# Patient Record
Sex: Female | Born: 1955 | Race: Black or African American | Hispanic: No | Marital: Married | Smoking: Never smoker
Health system: Southern US, Community
[De-identification: ages and names within clinical notes are randomized; demographics above are authoritative.]

## PROBLEM LIST (undated history)

## (undated) DIAGNOSIS — Z9889 Other specified postprocedural states: Secondary | ICD-10-CM

## (undated) DIAGNOSIS — H269 Unspecified cataract: Secondary | ICD-10-CM

## (undated) DIAGNOSIS — K220 Achalasia of cardia: Secondary | ICD-10-CM

## (undated) DIAGNOSIS — Z800 Family history of malignant neoplasm of digestive organs: Secondary | ICD-10-CM

## (undated) DIAGNOSIS — M810 Age-related osteoporosis without current pathological fracture: Secondary | ICD-10-CM

## (undated) DIAGNOSIS — W19XXXA Unspecified fall, initial encounter: Secondary | ICD-10-CM

## (undated) DIAGNOSIS — Z8042 Family history of malignant neoplasm of prostate: Secondary | ICD-10-CM

## (undated) DIAGNOSIS — Z803 Family history of malignant neoplasm of breast: Secondary | ICD-10-CM

## (undated) DIAGNOSIS — T148XXA Other injury of unspecified body region, initial encounter: Secondary | ICD-10-CM

## (undated) DIAGNOSIS — T7840XA Allergy, unspecified, initial encounter: Secondary | ICD-10-CM

## (undated) DIAGNOSIS — F329 Major depressive disorder, single episode, unspecified: Secondary | ICD-10-CM

## (undated) DIAGNOSIS — M8580 Other specified disorders of bone density and structure, unspecified site: Secondary | ICD-10-CM

## (undated) DIAGNOSIS — R9431 Abnormal electrocardiogram [ECG] [EKG]: Secondary | ICD-10-CM

## (undated) DIAGNOSIS — I341 Nonrheumatic mitral (valve) prolapse: Secondary | ICD-10-CM

## (undated) HISTORY — DX: Other specified disorders of bone density and structure, unspecified site: M85.80

## (undated) HISTORY — DX: Achalasia of cardia: K22.0

## (undated) HISTORY — DX: Family history of malignant neoplasm of breast: Z80.3

## (undated) HISTORY — DX: Allergy, unspecified, initial encounter: T78.40XA

## (undated) HISTORY — DX: Age-related osteoporosis without current pathological fracture: M81.0

## (undated) HISTORY — DX: Nonrheumatic mitral (valve) prolapse: I34.1

## (undated) HISTORY — PX: OTHER SURGICAL HISTORY: SHX169

## (undated) HISTORY — DX: Unspecified fall, initial encounter: W19.XXXA

## (undated) HISTORY — DX: Abnormal electrocardiogram (ECG) (EKG): R94.31

## (undated) HISTORY — DX: Other injury of unspecified body region, initial encounter: T14.8XXA

## (undated) HISTORY — PX: TUBAL LIGATION: SHX77

## (undated) HISTORY — DX: Family history of malignant neoplasm of digestive organs: Z80.0

## (undated) HISTORY — DX: Other specified postprocedural states: Z98.890

## (undated) HISTORY — DX: Family history of malignant neoplasm of prostate: Z80.42

## (undated) HISTORY — PX: MOUTH SURGERY: SHX715

## (undated) HISTORY — PX: ECTOPIC PREGNANCY SURGERY: SHX613

## (undated) HISTORY — DX: Major depressive disorder, single episode, unspecified: F32.9

## (undated) HISTORY — PX: BUNIONECTOMY: SHX129

## (undated) HISTORY — PX: HAMMER TOE SURGERY: SHX385

---
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
## 1982-04-09 DIAGNOSIS — I341 Nonrheumatic mitral (valve) prolapse: Secondary | ICD-10-CM

## 1982-04-09 HISTORY — DX: Nonrheumatic mitral (valve) prolapse: I34.1

## 1998-08-05 ENCOUNTER — Other Ambulatory Visit: Admission: RE | Admit: 1998-08-05 | Discharge: 1998-08-05 | Payer: Self-pay | Admitting: *Deleted

## 1999-01-11 ENCOUNTER — Encounter: Payer: Self-pay | Admitting: *Deleted

## 1999-01-11 ENCOUNTER — Ambulatory Visit (HOSPITAL_COMMUNITY): Admission: RE | Admit: 1999-01-11 | Discharge: 1999-01-11 | Payer: Self-pay | Admitting: *Deleted

## 2000-09-03 ENCOUNTER — Other Ambulatory Visit: Admission: RE | Admit: 2000-09-03 | Discharge: 2000-09-03 | Payer: Self-pay | Admitting: Obstetrics and Gynecology

## 2000-09-12 ENCOUNTER — Ambulatory Visit (HOSPITAL_COMMUNITY): Admission: RE | Admit: 2000-09-12 | Discharge: 2000-09-12 | Payer: Self-pay | Admitting: Obstetrics and Gynecology

## 2000-09-12 ENCOUNTER — Encounter: Payer: Self-pay | Admitting: Obstetrics and Gynecology

## 2001-09-15 ENCOUNTER — Ambulatory Visit (HOSPITAL_COMMUNITY): Admission: RE | Admit: 2001-09-15 | Discharge: 2001-09-15 | Payer: Self-pay | Admitting: Obstetrics and Gynecology

## 2001-09-15 ENCOUNTER — Encounter: Payer: Self-pay | Admitting: Obstetrics and Gynecology

## 2001-09-15 ENCOUNTER — Other Ambulatory Visit: Admission: RE | Admit: 2001-09-15 | Discharge: 2001-09-15 | Payer: Self-pay | Admitting: Obstetrics and Gynecology

## 2002-04-09 DIAGNOSIS — R9431 Abnormal electrocardiogram [ECG] [EKG]: Secondary | ICD-10-CM

## 2002-04-09 HISTORY — DX: Abnormal electrocardiogram (ECG) (EKG): R94.31

## 2002-04-12 ENCOUNTER — Encounter: Payer: Self-pay | Admitting: Emergency Medicine

## 2002-04-12 ENCOUNTER — Emergency Department (HOSPITAL_COMMUNITY): Admission: EM | Admit: 2002-04-12 | Discharge: 2002-04-12 | Payer: Self-pay | Admitting: Emergency Medicine

## 2003-03-26 ENCOUNTER — Other Ambulatory Visit: Admission: RE | Admit: 2003-03-26 | Discharge: 2003-03-26 | Payer: Self-pay | Admitting: Obstetrics and Gynecology

## 2003-03-29 ENCOUNTER — Ambulatory Visit (HOSPITAL_COMMUNITY): Admission: RE | Admit: 2003-03-29 | Discharge: 2003-03-29 | Payer: Self-pay | Admitting: Obstetrics and Gynecology

## 2004-04-04 ENCOUNTER — Ambulatory Visit (HOSPITAL_COMMUNITY): Admission: RE | Admit: 2004-04-04 | Discharge: 2004-04-04 | Payer: Self-pay | Admitting: Obstetrics and Gynecology

## 2004-05-30 ENCOUNTER — Ambulatory Visit: Payer: Self-pay | Admitting: Internal Medicine

## 2004-05-31 ENCOUNTER — Ambulatory Visit: Payer: Self-pay | Admitting: Family Medicine

## 2004-05-31 ENCOUNTER — Ambulatory Visit: Payer: Self-pay | Admitting: Internal Medicine

## 2004-08-03 ENCOUNTER — Other Ambulatory Visit: Admission: RE | Admit: 2004-08-03 | Discharge: 2004-08-03 | Payer: Self-pay | Admitting: Obstetrics and Gynecology

## 2005-01-16 ENCOUNTER — Ambulatory Visit: Payer: Self-pay | Admitting: Internal Medicine

## 2005-01-17 ENCOUNTER — Ambulatory Visit: Payer: Self-pay | Admitting: Internal Medicine

## 2005-05-08 ENCOUNTER — Ambulatory Visit (HOSPITAL_COMMUNITY): Admission: RE | Admit: 2005-05-08 | Discharge: 2005-05-08 | Payer: Self-pay | Admitting: Internal Medicine

## 2005-07-17 ENCOUNTER — Ambulatory Visit: Payer: Self-pay | Admitting: Internal Medicine

## 2005-07-19 ENCOUNTER — Ambulatory Visit: Payer: Self-pay | Admitting: Internal Medicine

## 2005-12-19 ENCOUNTER — Ambulatory Visit: Payer: Self-pay | Admitting: Internal Medicine

## 2006-05-20 ENCOUNTER — Ambulatory Visit: Payer: Self-pay | Admitting: Internal Medicine

## 2006-06-21 ENCOUNTER — Ambulatory Visit (HOSPITAL_COMMUNITY): Admission: RE | Admit: 2006-06-21 | Discharge: 2006-06-21 | Payer: Self-pay | Admitting: Internal Medicine

## 2006-11-25 ENCOUNTER — Encounter (INDEPENDENT_AMBULATORY_CARE_PROVIDER_SITE_OTHER): Payer: Self-pay | Admitting: *Deleted

## 2006-11-25 ENCOUNTER — Ambulatory Visit: Payer: Self-pay | Admitting: Internal Medicine

## 2006-12-02 ENCOUNTER — Encounter (INDEPENDENT_AMBULATORY_CARE_PROVIDER_SITE_OTHER): Payer: Self-pay | Admitting: *Deleted

## 2006-12-04 ENCOUNTER — Other Ambulatory Visit: Admission: RE | Admit: 2006-12-04 | Discharge: 2006-12-04 | Payer: Self-pay | Admitting: Gynecology

## 2006-12-04 ENCOUNTER — Encounter: Payer: Self-pay | Admitting: Internal Medicine

## 2006-12-11 ENCOUNTER — Ambulatory Visit: Payer: Self-pay | Admitting: Internal Medicine

## 2006-12-12 ENCOUNTER — Encounter (INDEPENDENT_AMBULATORY_CARE_PROVIDER_SITE_OTHER): Payer: Self-pay | Admitting: *Deleted

## 2007-02-25 ENCOUNTER — Ambulatory Visit: Payer: Self-pay | Admitting: Internal Medicine

## 2007-08-04 ENCOUNTER — Ambulatory Visit: Payer: Self-pay | Admitting: Internal Medicine

## 2007-08-06 ENCOUNTER — Encounter (INDEPENDENT_AMBULATORY_CARE_PROVIDER_SITE_OTHER): Payer: Self-pay | Admitting: *Deleted

## 2007-08-27 ENCOUNTER — Ambulatory Visit: Payer: Self-pay | Admitting: Pulmonary Disease

## 2007-09-19 ENCOUNTER — Ambulatory Visit (HOSPITAL_BASED_OUTPATIENT_CLINIC_OR_DEPARTMENT_OTHER): Admission: RE | Admit: 2007-09-19 | Discharge: 2007-09-19 | Payer: Self-pay | Admitting: Pulmonary Disease

## 2007-09-19 ENCOUNTER — Encounter: Payer: Self-pay | Admitting: Pulmonary Disease

## 2007-09-30 ENCOUNTER — Ambulatory Visit: Payer: Self-pay | Admitting: Pulmonary Disease

## 2007-10-15 ENCOUNTER — Ambulatory Visit: Payer: Self-pay | Admitting: Pulmonary Disease

## 2007-10-23 ENCOUNTER — Telehealth (INDEPENDENT_AMBULATORY_CARE_PROVIDER_SITE_OTHER): Payer: Self-pay | Admitting: *Deleted

## 2007-11-03 ENCOUNTER — Ambulatory Visit: Payer: Self-pay | Admitting: Internal Medicine

## 2007-12-05 ENCOUNTER — Other Ambulatory Visit: Admission: RE | Admit: 2007-12-05 | Discharge: 2007-12-05 | Payer: Self-pay | Admitting: Gynecology

## 2008-01-24 ENCOUNTER — Ambulatory Visit: Payer: Self-pay | Admitting: Internal Medicine

## 2009-01-15 ENCOUNTER — Emergency Department (HOSPITAL_COMMUNITY): Admission: EM | Admit: 2009-01-15 | Discharge: 2009-01-15 | Payer: Self-pay | Admitting: Emergency Medicine

## 2009-01-18 ENCOUNTER — Ambulatory Visit: Payer: Self-pay | Admitting: Internal Medicine

## 2009-01-20 ENCOUNTER — Encounter (INDEPENDENT_AMBULATORY_CARE_PROVIDER_SITE_OTHER): Payer: Self-pay | Admitting: *Deleted

## 2009-01-20 ENCOUNTER — Ambulatory Visit (HOSPITAL_COMMUNITY): Admission: RE | Admit: 2009-01-20 | Discharge: 2009-01-20 | Payer: Self-pay | Admitting: Gynecology

## 2009-01-20 LAB — CONVERTED CEMR LAB
Creatinine, Ser: 0.8 mg/dL (ref 0.4–1.2)
GFR calc non Af Amer: 97.26 mL/min (ref >60)

## 2009-04-22 ENCOUNTER — Ambulatory Visit: Payer: Self-pay | Admitting: Internal Medicine

## 2009-04-22 DIAGNOSIS — F3289 Other specified depressive episodes: Secondary | ICD-10-CM | POA: Insufficient documentation

## 2009-04-22 DIAGNOSIS — F329 Major depressive disorder, single episode, unspecified: Secondary | ICD-10-CM | POA: Insufficient documentation

## 2009-04-22 DIAGNOSIS — J302 Other seasonal allergic rhinitis: Secondary | ICD-10-CM | POA: Insufficient documentation

## 2009-04-25 ENCOUNTER — Encounter (INDEPENDENT_AMBULATORY_CARE_PROVIDER_SITE_OTHER): Payer: Self-pay | Admitting: *Deleted

## 2009-04-28 ENCOUNTER — Other Ambulatory Visit: Admission: RE | Admit: 2009-04-28 | Discharge: 2009-04-28 | Payer: Self-pay | Admitting: Gynecology

## 2009-04-28 ENCOUNTER — Ambulatory Visit: Payer: Self-pay | Admitting: Gynecology

## 2009-06-28 ENCOUNTER — Ambulatory Visit: Payer: Self-pay | Admitting: Gynecology

## 2009-09-23 ENCOUNTER — Encounter (INDEPENDENT_AMBULATORY_CARE_PROVIDER_SITE_OTHER): Payer: Self-pay | Admitting: *Deleted

## 2009-09-26 ENCOUNTER — Encounter (INDEPENDENT_AMBULATORY_CARE_PROVIDER_SITE_OTHER): Payer: Self-pay | Admitting: *Deleted

## 2009-09-27 ENCOUNTER — Ambulatory Visit: Payer: Self-pay | Admitting: Gastroenterology

## 2009-10-03 ENCOUNTER — Ambulatory Visit: Payer: Self-pay | Admitting: Gastroenterology

## 2010-02-22 ENCOUNTER — Ambulatory Visit (HOSPITAL_COMMUNITY): Admission: RE | Admit: 2010-02-22 | Discharge: 2010-02-22 | Payer: Self-pay | Admitting: Gynecology

## 2010-04-09 DIAGNOSIS — F32A Depression, unspecified: Secondary | ICD-10-CM

## 2010-04-09 HISTORY — PX: COLONOSCOPY: SHX174

## 2010-04-09 HISTORY — DX: Depression, unspecified: F32.A

## 2010-04-30 ENCOUNTER — Encounter: Payer: Self-pay | Admitting: Internal Medicine

## 2010-05-01 ENCOUNTER — Other Ambulatory Visit
Admission: RE | Admit: 2010-05-01 | Discharge: 2010-05-01 | Payer: Self-pay | Source: Home / Self Care | Admitting: Gynecology

## 2010-05-01 ENCOUNTER — Ambulatory Visit
Admission: RE | Admit: 2010-05-01 | Discharge: 2010-05-01 | Payer: Self-pay | Source: Home / Self Care | Attending: Gynecology | Admitting: Gynecology

## 2010-05-01 ENCOUNTER — Other Ambulatory Visit: Payer: Self-pay | Admitting: Gynecology

## 2010-05-07 LAB — CONVERTED CEMR LAB
AST: 19 units/L (ref 0–37)
Albumin: 3.7 g/dL (ref 3.5–5.2)
Alkaline Phosphatase: 68 units/L (ref 39–117)
Alkaline Phosphatase: 74 units/L (ref 39–117)
BUN: 10 mg/dL (ref 6–23)
BUN: 13 mg/dL (ref 6–23)
BUN: 6 mg/dL (ref 6–23)
Basophils Absolute: 0 10*3/uL (ref 0.0–0.1)
Basophils Absolute: 0 10*3/uL (ref 0.0–0.1)
Bilirubin, Direct: 0.1 mg/dL (ref 0.0–0.3)
CO2: 33 meq/L — ABNORMAL HIGH (ref 19–32)
CO2: 33 meq/L — ABNORMAL HIGH (ref 19–32)
Calcium: 9 mg/dL (ref 8.4–10.5)
Calcium: 9.5 mg/dL (ref 8.4–10.5)
Chloride: 101 meq/L (ref 96–112)
Chloride: 104 meq/L (ref 96–112)
Chloride: 105 meq/L (ref 96–112)
Cholesterol: 172 mg/dL (ref 0–200)
Cholesterol: 185 mg/dL (ref 0–200)
Cholesterol: 192 mg/dL (ref 0–200)
Eosinophils Absolute: 0 10*3/uL (ref 0.0–0.7)
Free T4: 0.6 ng/dL (ref 0.6–1.6)
GFR calc Af Amer: 86 mL/min
Glucose, Bld: 62 mg/dL — ABNORMAL LOW (ref 70–99)
Glucose, Bld: 86 mg/dL (ref 70–99)
Glucose, Bld: 88 mg/dL (ref 70–99)
HCT: 39 % (ref 36.0–46.0)
HCT: 40.3 % (ref 36.0–46.0)
HDL: 101.7 mg/dL (ref >39.0)
HDL: 87 mg/dL (ref >39)
Hemoglobin: 13.3 g/dL (ref 12.0–15.0)
Indirect Bilirubin: 0.5 mg/dL (ref 0.0–0.9)
LDL Cholesterol: 64 mg/dL (ref 0–99)
LDL Cholesterol: 75 mg/dL (ref 0–99)
Lymphocytes Relative: 37 % (ref 12.0–46.0)
MCHC: 32.9 g/dL (ref 30.0–36.0)
MCHC: 33 g/dL (ref 30.0–36.0)
MCHC: 33.8 g/dL (ref 30.0–36.0)
MCV: 88.4 fL (ref 78.0–100.0)
MCV: 90.5 fL (ref 78.0–100.0)
MCV: 92.2 fL (ref 78.0–100.0)
Monocytes Absolute: 0.3 10*3/uL (ref 0.1–1.0)
Monocytes Absolute: 0.3 10*3/uL (ref 0.2–0.7)
Monocytes Relative: 4.8 % (ref 3.0–12.0)
Monocytes Relative: 4.9 % (ref 3.0–11.0)
Neutro Abs: 3.4 10*3/uL (ref 1.4–7.7)
Neutro Abs: 3.8 10*3/uL (ref 1.4–7.7)
Neutrophils Relative %: 50.8 % (ref 43.0–77.0)
Neutrophils Relative %: 57.4 % (ref 43.0–77.0)
Platelets: 194 10*3/uL (ref 150–400)
Platelets: 196 10*3/uL (ref 150.0–400.0)
Platelets: 202 10*3/uL (ref 150–400)
Potassium: 4.3 meq/L (ref 3.5–5.1)
Potassium: 4.3 meq/L (ref 3.5–5.3)
RBC: 4.37 M/uL (ref 3.87–5.11)
RBC: 4.58 M/uL (ref 3.87–5.11)
RDW: 13.4 % (ref 11.5–14.6)
RDW: 13.6 % (ref 11.5–14.6)
RDW: 13.8 % (ref 11.5–14.6)
Sodium: 140 meq/L (ref 135–145)
Sodium: 140 meq/L (ref 135–145)
Sodium: 143 meq/L (ref 135–145)
T3, Free: 3.2 pg/mL (ref 2.3–4.2)
TSH: 1.1 microintl units/mL (ref 0.35–5.50)
Total CHOL/HDL Ratio: 1.8
Total Protein: 7.4 g/dL (ref 6.0–8.3)
Triglycerides: 44 mg/dL (ref 0–149)
Uric Acid, Serum: 4.7 mg/dL (ref 2.4–7.0)
VLDL: 8 mg/dL (ref 0–40)
VLDL: 9 mg/dL (ref 0–40)
WBC: 6.4 10*3/uL (ref 4.5–10.5)
WBC: 6.9 10*3/uL (ref 4.5–10.5)
aPTT: 32 s (ref 24–37)

## 2010-05-09 NOTE — Miscellaneous (Signed)
Summary: previsit/rm  Clinical Lists Changes  Medications: Added new medication of MOVIPREP 100 GM  SOLR (PEG-KCL-NACL-NASULF-NA ASC-C) As per prep instructions. - Signed Rx of MOVIPREP 100 GM  SOLR (PEG-KCL-NACL-NASULF-NA ASC-C) As per prep instructions.;  #1 x 0;  Signed;  Entered by: Sherren Kerns RN;  Authorized by: Meryl Dare MD [facility];  Method used: [facility] [phone]*, [address], [place], [place], [place]  [address], Ph: [phone], Fax: [phone] Observations: Added new observation of ALLERGY REV: Done (09/27/2009 15:51)    Prescriptions: MOVIPREP 100 GM  SOLR (PEG-KCL-NACL-NASULF-NA ASC-C) As per prep instructions.  #1 x 0   Entered by:   Sherren Kerns RN   Authorized by:   Meryl Dare MD [facility]   Signed by:   Sherren Kerns RN on 09/27/2009   Method used:   Electronically to        [facility] [phone]* (retail)       [address]       [place], [place]  [address]       Ph: [phone]       Fax: [phone]   RxID:   [id number]

## 2010-05-09 NOTE — Letter (Signed)
Summary: Previsit letter  [facility]  [address] [place], [place] [address]   Phone: [phone]  Fax: [phone]       09/23/2009 MRN: [id number]  [facility] [address] [place], [place]  [address]  Dear Ms. Kronk,  Welcome to the Gastroenterology Division at [facility].    You are scheduled to see a nurse for your pre-procedure visit on 09-27-09 at 4:00p.m. on the 3rd floor at [facility], [address]  We ask that you try to arrive at our office 15 minutes prior to your appointment time to allow for check-in.  Your nurse visit will consist of discussing your medical and surgical history, your immediate family medical history, and your medications.    Please bring a complete list of all your medications or, if you prefer, bring the medication bottles and we will list them.  We will need to be aware of both prescribed and over the counter drugs.  We will need to know exact dosage information as well.  If you are on blood thinners (Coumadin, Plavix, Aggrenox, Ticlid, etc.) please call our office today/prior to your appointment, as we need to consult with your physician about holding your medication.   Please be prepared to read and sign documents such as consent forms, a financial agreement, and acknowledgement forms.  If necessary, and with your consent, a friend or relative is welcome to sit-in on the nurse visit with you.  Please bring your insurance card so that we may make a copy of it.  If your insurance requires a referral to see a specialist, please bring your referral form from your primary care physician.  No co-pay is required for this nurse visit.     If you cannot keep your appointment, please call [phone] to cancel or reschedule prior to your appointment date.  This allows [place] the opportunity to schedule an appointment for another patient in need of care.    Thank you for choosing [facility] for your medical needs.   We appreciate the opportunity to care for you.  Please visit [place] at our website  to learn more about our practice.                     Sincerely.                                                                                                                   The Gastroenterology Division

## 2010-05-09 NOTE — Letter (Signed)
Summary: Results Follow up Letter  [facility] [place]  [address] [place], [place] [address]   Phone: [phone]  Fax: [phone]    04/25/2009 MRN: [id number]  [facility] [address] [place], [place]  [address]  Dear Sylvia Mcintosh,  The following are the results of your recent test(s):  Test         Result    Pap Smear:        Normal _____  Not Normal _____ Comments: ______________________________________________________ Cholesterol: LDL(Bad cholesterol):         Your goal is less than:         HDL (Good cholesterol):       Your goal is more than: Comments:  ______________________________________________________ Mammogram:        Normal _____  Not Normal _____ Comments:  ___________________________________________________________________ Hemoccult:        Normal _____  Not normal _______ Comments:    _____________________________________________________________________ Other Tests: Please see attached labs done on 04/22/2009    We routinely do not discuss normal results over the telephone.  If you desire a copy of the results, or you have any questions about this information we can discuss them at your next office visit.   Sincerely,

## 2010-05-09 NOTE — Letter (Signed)
Summary: [facility] Instructions  [facility]  [address] [place], [place] [address]   Phone: [phone]  Fax: [phone]       Sylvia Mcintosh    09/24/1955    MRN: [id number]        Procedure Day /Date:  10/03/09  Monday     Arrival Time:  9:30am      Procedure Time: 10:30am     Location of Procedure:                    _x _  [facility] (4th Floor)   PREPARATION FOR COLONOSCOPY WITH MOVIPREP   Starting 5 days prior to your procedure 09/28/09  do not eat nuts, seeds, popcorn, corn, beans, peas,  salads, or any raw vegetables.  Do not take any fiber supplements (e.g. Metamucil, Citrucel, and Benefiber).  THE DAY BEFORE YOUR PROCEDURE         DATE:   10/02/09  DAY:  Sunday  1.  Drink clear liquids the entire day-NO SOLID FOOD  2.  Do not drink anything colored red or purple.  Avoid juices with pulp.  No orange juice.  3.  Drink at least 64 oz. (8 glasses) of fluid/clear liquids during the day to prevent dehydration and help the prep work efficiently.  CLEAR LIQUIDS INCLUDE: Water Jello Ice Popsicles Tea (sugar ok, no milk/cream) Powdered fruit flavored drinks Coffee (sugar ok, no milk/cream) Gatorade Juice: apple, white grape, white cranberry  Lemonade Clear bullion, consomm, broth Carbonated beverages (any kind) Strained chicken noodle soup Hard Candy                             4.  In the morning, mix first dose of MoviPrep solution:    Empty 1 Pouch A and 1 Pouch B into the disposable container    Add lukewarm drinking water to the top line of the container. Mix to dissolve    Refrigerate (mixed solution should be used within 24 hrs)  5.  Begin drinking the prep at 5:00 p.m. The MoviPrep container is divided by 4 marks.   Every 15 minutes drink the solution down to the next mark (approximately 8 oz) until the full liter is complete.   6.  Follow completed prep with 16 oz of clear liquid of your choice (Nothing red or purple).   Continue to drink clear liquids until bedtime.  7.  Before going to bed, mix second dose of MoviPrep solution:    Empty 1 Pouch A and 1 Pouch B into the disposable container    Add lukewarm drinking water to the top line of the container. Mix to dissolve    Refrigerate  THE DAY OF YOUR PROCEDURE      DATE:  6//27/11 DAY:  Monday  Beginning at 5:30 a.m. (5 hours before procedure):         1. Every 15 minutes, drink the solution down to the next mark (approx 8 oz) until the full liter is complete.  2. Follow completed prep with 16 oz. of clear liquid of your choice.    3. You may drink clear liquids until  8:30 a.m. (2 HOURS BEFORE PROCEDURE).   MEDICATION INSTRUCTIONS  Unless otherwise instructed, you should take regular prescription medications with a small sip of water   as early as possible the morning of your procedure.    Additional medication instructions: n/a  OTHER INSTRUCTIONS  You will need a responsible adult at least 53 years of age to accompany you and drive you home.   This person must remain in the waiting room during your procedure.  Wear loose fitting clothing that is easily removed.  Leave jewelry and other valuables at home.  However, you may wish to bring a book to read or  an iPod/MP3 player to listen to music as you wait for your procedure to start.  Remove all body piercing jewelry and leave at home.  Total time from sign-in until discharge is approximately 2-3 hours.  You should go home directly after your procedure and rest.  You can resume normal activities the  day after your procedure.  The day of your procedure you should not:   Drive   Make legal decisions   Operate machinery   Drink alcohol   Return to work  You will receive specific instructions about eating, activities and medications before you leave.    The above instructions have been reviewed and explained to me by   Sherren Kerns RN  September 27, 2009 4:16  PM     I fully understand and can verbalize these instructions _____________________________ Date _________

## 2010-05-09 NOTE — Assessment & Plan Note (Signed)
Summary: CPX//PH   Vital Signs:  Patient profile:   55 year old female Height:      65.5 inches Weight:      159.8 pounds BMI:     26.28 Temp:     98.5 degrees F oral Pulse rate:   72 / minute Resp:     14 per minute BP sitting:   116 / 78  (left arm) Cuff size:   large  Vitals Entered By: Shonna Chock (April 22, 2009 9:30 AM)  Comments REVIEWED MED LIST, PATIENT AGREED DOSE AND INSTRUCTION CORRECT    Primary Care Provider:  Lona Kettle   History of Present Illness: Sylvia Mcintosh is here for a physical; she is asymptomatic.  Preventive Screening-Counseling & Management  Caffeine-Diet-Exercise     Does Patient Exercise: yes  Allergies (verified): No Known Drug Allergies  Past History:  Past Medical History: SBE PROPHYLAXIS PREVIOUSLY  REQUIRED as per [employer] physicians for  MVP RIGHT femoral bruit non specific ST changes with negative stress test 2004 elevated homocysteine level  Allergic rhinitis  Past Surgical History: Tubal ligation ; reversed gravid 6 para 2 miscarriage 4(tubal pregnancies times two); Bunionectomy & hammer toe surgery bilat; Oral Surgery (implants) ; No Colonoscopy to date [facility] reviewed)  Family History: Father: ?health history Mother: breast cancer Siblings: sister  &bro HTN;son  possible SIDS 1993(negative autopsy); M cousin colon cancer in her 24s  Social History: Never Smoked. Alcohol use-no Occupation: Banker Married Regular exercise-yes: 2X/week CVE  Review of Systems  The patient denies anorexia, fever, vision loss, decreased hearing, hoarseness, chest pain, syncope, dyspnea on exertion, peripheral edema, prolonged cough, headaches, hemoptysis, abdominal pain, melena, hematochezia, severe indigestion/heartburn, hematuria, incontinence, suspicious skin lesions, unusual weight change, abnormal bleeding, enlarged lymph nodes, and angioedema.         Weight loss 30 # over 5 months with TLC. Psych:  Denies  anxiety, easily angered, easily tearful, and irritability; Rereading data regarding son's death has caused some depression;she questions PTSD.  Physical Exam  General:  well-nourished,in no acute distress; alert,appropriate and cooperative throughout examination Head:  Normocephalic and atraumatic without obvious abnormalities.  Eyes:  No corneal or conjunctival inflammation noted. Arcus senilis. Perrla. Funduscopic exam benign, without hemorrhages, exudates or papilledema.  Ears:  External ear exam shows no significant lesions or deformities.  Otoscopic examination reveals clear canals, tympanic membranes are intact bilaterally without bulging, retraction, inflammation or discharge. Hearing is grossly normal bilaterally.Some wax bilaterally Nose:  External nasal examination shows no deformity or inflammation. Nasal mucosa are pink and moist without lesions or exudates. Mouth:  Oral mucosa and oropharynx without lesions or exudates.  Teeth in good repair. Neck:  No deformities, masses, or tenderness noted. Lungs:  Normal respiratory effort, chest expands symmetrically. Lungs are clear to auscultation, no crackles or wheezes. Heart:  Normal rate and regular rhythm. S1 and S2 normal without gallop, murmur, click, rub . S4 with  minor slurring Abdomen:  Bowel sounds positive,abdomen soft and non-tender without masses, organomegaly or hernias noted. Genitalia:  Dr Audie Box Msk:  No deformity or scoliosis noted of thoracic or lumbar spine.   Pulses:  R and L carotid,radial,dorsalis pedis and posterior tibial pulses are full and equal bilaterally Extremities:  No clubbing, cyanosis, edema, or deformity noted with normal full range of motion of all joints. Osteophyte L index DIP Neurologic:  alert & oriented X3 and DTRs symmetrical and normal.   Skin:  Intact without suspicious lesions or rashes Cervical Nodes:  No lymphadenopathy noted Axillary Nodes:  No palpable lymphadenopathy Psych:  memory  intact for recent and remote,  good eye contact, not anxious appearing, but  subdued.     Impression & Recommendations:  Problem # 1:  PREVENTIVE HEALTH CARE (ICD-V70.0)  Orders: EKG w/ Interpretation (93000) Venipuncture (40981) TLB-Lipid Panel (80061-LIPID) TLB-BMP (Basic Metabolic Panel-BMET) (80048-METABOL) TLB-CBC Platelet - w/Differential (85025-CBCD) TLB-Hepatic/Liver Function Pnl (80076-HEPATIC) TLB-TSH (Thyroid Stimulating Hormone) (84443-TSH)  Problem # 2:  DEPRESSIVE DISORDER NOT ELSEWHERE CLASSIFIED (ICD-311) Situational, mild  Problem # 3:  ALLERGIC RHINITIS (ICD-477.9)  Her updated medication list for this problem includes:    Claritin 10 Mg Caps (Loratadine) .Marland Kitchen... 1 once daily prn  Complete Medication List: 1)  Claritin 10 Mg Caps (Loratadine) .Marland Kitchen.. 1 once daily prn  Patient Instructions: 1)  Please call if medication trial desired  Appended Document: Orders Update    Clinical Lists Changes  Orders: Added new Test order of T- * Misc. Laboratory test [phone]) - Signed

## 2010-05-09 NOTE — Procedures (Signed)
Summary: Colonoscopy  Patient: Sylvia Mcintosh Note: All result statuses are Final unless otherwise noted.  Tests: (1) Colonoscopy (COL)   COL Colonoscopy           DONE     [facility]     [address]     [place], [place]  [address]           COLONOSCOPY PROCEDURE REPORT           PATIENT:  [name], [name]  MR#:  [id number]     BIRTHDATE:  April 30, 1955, 53 yrs. old  GENDER:  female     ENDOSCOPIST:  Judie Petit T. Russella Dar, MD, [facility]     Referred by:  Marga Melnick, M.D.     PROCEDURE DATE:  10/03/2009     PROCEDURE:  Colonoscopy 562[address]     ASA CLASS:  Class I     INDICATIONS:  1) Routine Risk Screening     MEDICATIONS:   Fentanyl 75 mcg IV, Versed 7 mg IV     DESCRIPTION OF PROCEDURE:   After the risks benefits and     alternatives of the procedure were thoroughly explained, informed     consent was obtained.  Digital rectal exam was performed and     revealed no abnormalities.   The LB [id number]-[id number] endoscope     was introduced through the anus and advanced to the cecum, which     was identified by both the appendix and ileocecal valve, without     limitations.  The quality of the prep was good, using MoviPrep.     The instrument was then slowly withdrawn as the colon was fully     examined.     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 A normal     appearing cecum, ileocecal valve, and appendiceal orifice were     identified. The ascending, hepatic flexure, transverse, splenic     flexure and rectum appeared unremarkable. Retroflexed views in the     rectum revealed no abnormalities.    The time to cecum =  3     minutes. The scope was then withdrawn (time =  9.75  min) from the     patient and the procedure completed.     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     2)  Continue current colorectal screening recommendations for     "routine risk" patients with a repeat colonoscopy in 10 years.           Venita Lick. Russella Dar, MD, Clementeen Graham           n.     eSIGNED:   Venita Lick. Stark at 10/03/2009 10:48 AM           Adachi, Vickey Huger, [id number]  Note: An exclamation mark (!) indicates a result that was not dispersed into the flowsheet. Document Creation Date: 10/03/2009 10:50 AM _______________________________________________________________________  (1) Order result status: Final Collection or observation date-time: 10/03/2009 10:45 Requested date-time:  Receipt date-time:  Reported date-time:  Referring Physician:   Ordering Physician: Claudette Head [id number]) Specimen Source:  Source: Launa Grill Order Number: [id number] Lab site:   Appended Document: Colonoscopy     Procedures  Next Due Date:    Colonoscopy: 10/2019

## 2010-06-12 ENCOUNTER — Encounter: Payer: Self-pay | Admitting: Internal Medicine

## 2010-06-12 ENCOUNTER — Ambulatory Visit (INDEPENDENT_AMBULATORY_CARE_PROVIDER_SITE_OTHER): Payer: BC Managed Care – PPO | Admitting: Internal Medicine

## 2010-06-12 DIAGNOSIS — H15009 Unspecified scleritis, unspecified eye: Secondary | ICD-10-CM | POA: Insufficient documentation

## 2010-06-20 NOTE — Assessment & Plan Note (Signed)
Summary: left eye--broken blood veins///sph   Vital Signs:  Patient profile:   55 year old female Weight:      166.6 pounds BMI:     27.40 Temp:     98.1 degrees F oral Pulse rate:   76 / minute Resp:     14 per minute BP sitting:   118 / 72  (left arm) Cuff size:   large  Vitals Entered By: Shonna Chock CMA (June 12, 2010 4:28 PM) CC: Left eye concerns: on Saturday patient with one red spot in the corner of eye, now spot has spread. Patient denies pain, blurred vision or injury to eye   Primary Care Provider:  Lona Kettle  CC:  Left eye concerns: on Saturday patient with one red spot in the corner of eye, now spot has spread. Patient denies pain, and blurred vision or injury to eye.  History of Present Illness:    Onset 03/03 as painless broken blood vessels in OS  noted by husband; later that day she had headache above the eye when she stayed up late. The same symptoms recurred last night.  in both instancces symptms resolved with rest.  See ROS   Current Medications (verified): 1)  Claritin 10 Mg  Caps (Loratadine) .Marland Kitchen.. 1 Once Daily Prn  Allergies (verified): No Known Drug Allergies  Review of Systems General:  Denies chills, fever, and sweats. Eyes:  Complains of eye irritation and light sensitivity; denies blurring, discharge, double vision, and vision loss-1 eye. ENT:  Complains of earache; denies ear discharge and nosebleeds; No facial pain , purulence or frontal  headache except late @ night.  L earache 03/04 for short period . Resp:  Denies coughing up blood. GI:  Denies bloody stools and dark tarry stools. GU:  Denies hematuria. Heme:  Denies abnormal bruising and bleeding.  Physical Exam  General:  well-nourished,in no acute distress; alert,appropriate and cooperative throughout examination Eyes:  No corneal or conjunctival inflammation noted. EOMI EXCEPT OS deviates laterally with accommodation. Arcus senilis.Slight scleritis OS upper , outer quadrant. Perrla.  Vision grossly normal. Ears:  L ear normal.  Wax on R Nose:  External nasal examination shows no deformity or inflammation. Nasal mucosa are pink and moist without lesions or exudates. Mouth:  Oral mucosa and oropharynx without lesions or exudates.  Teeth in good repair. Skin:  Intact without suspicious lesions or rashes Cervical Nodes:  No lymphadenopathy noted Axillary Nodes:  No palpable lymphadenopathy   Impression & Recommendations:  Problem # 1:  SCLERITIS (ICD-379.00) no evidence of infection , HTN, or bleeding dyscrasia  Complete Medication List: 1)  Claritin 10 Mg Caps (Loratadine) .Marland Kitchen.. 1 once daily prn  Patient Instructions: 1)  Natural Tears as needed to keep eye moist . 2)  See your eye doctor yearly; discuss Arcus Senilis & decreased accommodation OS .   Orders Added: 1)  Est. Patient Level III [04540]

## 2010-07-13 LAB — GLUCOSE, CAPILLARY: Glucose-Capillary: 83 mg/dL (ref 70–99)

## 2010-08-08 ENCOUNTER — Encounter: Payer: BC Managed Care – PPO | Admitting: Internal Medicine

## 2010-08-17 ENCOUNTER — Encounter: Payer: BC Managed Care – PPO | Admitting: Internal Medicine

## 2010-08-22 NOTE — Procedures (Signed)
NAMEMABLE, DARA NO.:  [id number]   MEDICAL RECORD NO.:  [id number]          PATIENT TYPE:  OUT   LOCATION:  SLEEP CENTER                 FACILITY:  [facility]   PHYSICIAN:  Barbaraann Share, MD,FCCPDATE OF BIRTH:  07[birth year]07/1955   DATE OF STUDY:  09/19/2007                            NOCTURNAL POLYSOMNOGRAM   REFERRING PHYSICIAN:  Barbaraann Share, MD,FCCP   INDICATION FOR STUDY:  Hypersomnia, with sleep apnea.   EPWORTH SLEEPINESS SCORE:  16.   MEDICATIONS:   SLEEP ARCHITECTURE:  The patient had a total sleep time of 387 minutes,  with very little slow-wave sleep, and decreased REM.  Sleep onset  latency was normal at 4 minutes, and REM onset was normal at 77 minutes.  Sleep efficiency was fairly good at 93%.   RESPIRATORY DATA:  The patient was found to have 2 apneas and no  hypopneas, for an AHI of 0.3 events per hour.  There was mild snoring  noted throughout.   OXYGEN DATA:  There was transient O2 desaturation to 89% with the  patient's rare obstructive events.   CARDIAC DATA:  No clinically significant arrhythmias were noted.   MOVEMENT-PARASOMNIA:  The patient had no abnormal leg jerks or  behaviors.   IMPRESSIONS-RECOMMENDATIONS:  Small numbers of obstructive events which  do not meet the AHI criteria for the obstructive sleep apnea syndrome.  If the patient continues to have significant daytime sleepiness, further  evaluation for other sleep disorders or medical issues should be  considered.      Barbaraann Share, MD,FCCP  Diplomate, American Board of Sleep  Medicine  Electronically Signed     [facility]  D:  10/01/2007 08:39:50  T:  10/01/2007 08:51:53  Job:  [id number]

## 2010-08-28 ENCOUNTER — Other Ambulatory Visit: Payer: Self-pay | Admitting: Internal Medicine

## 2010-08-28 ENCOUNTER — Ambulatory Visit (INDEPENDENT_AMBULATORY_CARE_PROVIDER_SITE_OTHER): Payer: BC Managed Care – PPO | Admitting: Internal Medicine

## 2010-08-28 ENCOUNTER — Encounter: Payer: Self-pay | Admitting: Internal Medicine

## 2010-08-28 VITALS — BP 114/68 | HR 72 | Temp 97.8°F | Ht 65.5 in | Wt 164.8 lb

## 2010-08-28 DIAGNOSIS — E559 Vitamin D deficiency, unspecified: Secondary | ICD-10-CM

## 2010-08-28 DIAGNOSIS — M949 Disorder of cartilage, unspecified: Secondary | ICD-10-CM

## 2010-08-28 DIAGNOSIS — M8580 Other specified disorders of bone density and structure, unspecified site: Secondary | ICD-10-CM

## 2010-08-28 DIAGNOSIS — Z23 Encounter for immunization: Secondary | ICD-10-CM

## 2010-08-28 DIAGNOSIS — M899 Disorder of bone, unspecified: Secondary | ICD-10-CM

## 2010-08-28 DIAGNOSIS — R5381 Other malaise: Secondary | ICD-10-CM

## 2010-08-28 DIAGNOSIS — Z0000 Encounter for general adult medical examination without abnormal findings: Secondary | ICD-10-CM

## 2010-08-28 DIAGNOSIS — R5383 Other fatigue: Secondary | ICD-10-CM

## 2010-08-28 LAB — BASIC METABOLIC PANEL
BUN: 13 mg/dL (ref 6–23)
CO2: 31 mEq/L (ref 19–32)
Chloride: 102 mEq/L (ref 96–112)
Creatinine, Ser: 0.8 mg/dL (ref 0.4–1.2)
Glucose, Bld: 79 mg/dL (ref 70–99)
Potassium: 4.5 mEq/L (ref 3.5–5.1)

## 2010-08-28 LAB — CBC WITH DIFFERENTIAL/PLATELET
Basophils Absolute: 0 10*3/uL (ref 0.0–0.1)
Lymphocytes Relative: 45 % (ref 12.0–46.0)
Lymphs Abs: 2.7 10*3/uL (ref 0.7–4.0)
MCHC: 33.5 g/dL (ref 30.0–36.0)
MCV: 92.2 fl (ref 78.0–100.0)
RBC: 4.1 Mil/uL (ref 3.87–5.11)
RDW: 14.1 % (ref 11.5–14.6)

## 2010-08-28 LAB — LIPID PANEL
Cholesterol: 177 mg/dL (ref 0–200)
LDL Cholesterol: 66 mg/dL (ref 0–99)
VLDL: 7 mg/dL (ref 0.0–40.0)

## 2010-08-28 LAB — HEPATIC FUNCTION PANEL
ALT: 14 U/L (ref 0–35)
AST: 18 U/L (ref 0–37)

## 2010-08-28 LAB — T4, FREE: Free T4: 0.71 ng/dL (ref 0.60–1.60)

## 2010-08-28 MED ORDER — TETANUS-DIPHTH-ACELL PERTUSSIS 5-2.5-18.5 LF-MCG/0.5 IM SUSP
0.5000 mL | Freq: Once | INTRAMUSCULAR | Status: AC
  Administered 2010-08-28: 0.5 mL via INTRAMUSCULAR

## 2010-08-28 NOTE — Progress Notes (Signed)
Subjective:    Patient ID: Sylvia Mcintosh, female    DOB: October 11, 1955, 55 y.o.   MRN: [id number]  HPI She is here for a physical; she is asymptomatic    Review of Systems Patient reports no significant  vision/ hearing  changes, adenopathy,fever, weight change,  persistant / recurrent hoarseness , swallowing issues, chest pain,palpitations,edema,persistant /recurrent cough, hemoptysis, dyspnea( rest/ exertional/paroxysmal nocturnal), gastrointestinal bleeding(melena, rectal bleeding), abdominal pain, significant heartburn,  bowel changes,GU symptoms(dysuria, hematuria,pyuria, incontinence) ), Gyn symptoms(abnormal  bleeding , pain),  syncope, focal weakness, memory loss,numbness & tingling, skin/hair /nail changes,abnormal bruising or bleeding, anxiety,or depression. She describes some fatigue and heat intolerance.     Objective:   Physical Exam Gen.: Healthy and well-nourished in appearance. Alert, appropriate and cooperative throughout exam. Head: Normocephalic without obvious abnormalities. Eyes: No corneal or conjunctival inflammation noted. Pupils equal round reactive to light and accommodation. Fundal exam is benign without hemorrhages, exudate, papilledema. Extraocular motion intact. Vision grossly normal.Arcus present Ears: External  ear exam reveals no significant lesions or deformities. Canals reveal some wax bilaterally. Hearing is grossly normal bilaterally. Nose: External nasal exam reveals no deformity or inflammation. Nasal mucosa are pink and moist. No lesions or exudates noted. Septum   Not deviated  Mouth: Oral mucosa and oropharynx reveal no lesions or exudates. Teeth in good repair. Neck: No deformities, masses, or tenderness noted. Range of motion &. Thyroid  normal. Lungs: Normal respiratory effort; chest expands symmetrically. Lungs are clear to auscultation without rales, wheezes, or increased work of breathing. Heart: Normal rate and rhythm. Normal S1 and S2. No gallop,  click, or rub. S4 with slurring intermittently ; no murmur. Abdomen: Bowel sounds normal; abdomen soft and nontender. No masses, organomegaly or hernias noted. Genitalia:  Dr Audie Box.                                                                                                                                         Musculoskeletal/extremities: No deformity or scoliosis noted of  the thoracic or lumbar spine. No clubbing, cyanosis, edema, or deformity noted. Range of motion  normal .Tone & strength  normal.Joints normal. Nail health  good. Vascular: Carotid, radial artery, dorsalis pedis and dorsalis posterior tibial pulses are full and equal. No bruits present. Neurologic: Alert and oriented x3. Deep tendon reflexes symmetrical and normal.                                                                          Skin: Intact without suspicious lesions or rashes. Lymph: No cervical, axillary  lymphadenopathy present. Psych: Mood and affect are normal. Normally interactive  Assessment & Plan:  #1 comprehensive physical exam; no acute issues  #2 fatigue and heat intolerance  Plan: See orders and recommendations.

## 2010-08-28 NOTE — Patient Instructions (Addendum)
Preventive Health Care: Exercise  30-45  minutes a day, 3-4 days a week. Walking is especially valuable in preventing Osteoporosis. Eat a low-fat diet with lots of fruits and vegetables, up to 7-9 servings per day. Avoid obesity; your goal = waist less than 35 inches.Consume less than 30 grams of sugar per day from foods & drinks with High Fructose Corn Syrup as #2,3 or #4 on label. Health Care Power of Attorney & Living Will place you in charge of your health care  decisions. Verify these are  in place.  Please obtain a copy of the 2-D echocardiogram from 1984 for inclusion in  Your  record.

## 2010-08-29 LAB — VITAMIN D 25 HYDROXY (VIT D DEFICIENCY, FRACTURES): Vit D, 25-Hydroxy: 43 ng/mL (ref 30–89)

## 2010-11-09 ENCOUNTER — Ambulatory Visit (INDEPENDENT_AMBULATORY_CARE_PROVIDER_SITE_OTHER): Payer: BC Managed Care – PPO | Admitting: Internal Medicine

## 2010-11-09 ENCOUNTER — Telehealth: Payer: Self-pay | Admitting: Internal Medicine

## 2010-11-09 ENCOUNTER — Encounter: Payer: Self-pay | Admitting: Internal Medicine

## 2010-11-09 VITALS — BP 126/84 | HR 64 | Wt 174.6 lb

## 2010-11-09 DIAGNOSIS — F329 Major depressive disorder, single episode, unspecified: Secondary | ICD-10-CM

## 2010-11-09 MED ORDER — CITALOPRAM HYDROBROMIDE 20 MG PO TABS: 20.0000 mg | ORAL_TABLET | Freq: Every day | ORAL | Status: DC

## 2010-11-09 MED ORDER — LORAZEPAM 0.5 MG PO TABS: 0.5000 mg | ORAL_TABLET | Freq: Three times a day (TID) | ORAL | Status: DC | PRN
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
## 2010-11-09 NOTE — Telephone Encounter (Signed)
Dr.Hopper please advise 

## 2010-11-09 NOTE — Telephone Encounter (Signed)
Spoke w/ pt & she is aware

## 2010-11-09 NOTE — Telephone Encounter (Signed)
Citalopram 20 mg 1 daily dispense 30 refill x2. Lorazepam 0.5 mg one every 8 hours as needed for anxiety. dispense 30.

## 2010-11-09 NOTE — Patient Instructions (Signed)
Share lab results with [facility]

## 2010-11-28 ENCOUNTER — Encounter: Payer: Self-pay | Admitting: Internal Medicine

## 2010-11-28 ENCOUNTER — Ambulatory Visit (INDEPENDENT_AMBULATORY_CARE_PROVIDER_SITE_OTHER): Payer: BC Managed Care – PPO | Admitting: Internal Medicine

## 2010-11-28 VITALS — BP 124/80 | HR 70 | Temp 98.6°F | Wt 173.8 lb

## 2010-11-28 DIAGNOSIS — F3289 Other specified depressive episodes: Secondary | ICD-10-CM

## 2010-11-28 DIAGNOSIS — F329 Major depressive disorder, single episode, unspecified: Secondary | ICD-10-CM

## 2010-11-28 NOTE — Progress Notes (Signed)
  Subjective:    Patient ID: Sylvia Mcintosh, female    DOB: 1956/02/22, 55 y.o.   MRN: [id number]  HPI She was intolerant of the cells however I am; she states "it made me paranoid." The lorazepam has been used as needed, not on a regular basis. She is working with Marchelle Folks her counselor and his increased her exercise program.  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     Review of Systems      Objective:   Physical Exam she is inaccurately dressed and coiffered. She is open and honest with her answers.  The mood disorder questionnaire (MDQ) revealed a 8 positive answers. This was described as a minor problem.        Assessment & Plan:   #1 situational anxiety/depression; clinically improved. The adverse reaction to the SSRI and the mood disorder questionnaire raise the possibility of mood disorder. An evaluation by a psychiatrist would be indicated to rule this out.

## 2010-11-28 NOTE — Patient Instructions (Signed)
Medically I do not recommend you return to work until the specialty evaluation is completed as we discussed. This will be  expedited  ASAP.

## 2011-01-30 ENCOUNTER — Other Ambulatory Visit: Payer: Self-pay | Admitting: Internal Medicine

## 2011-01-30 DIAGNOSIS — Z1231 Encounter for screening mammogram for malignant neoplasm of breast: Secondary | ICD-10-CM

## 2011-02-26 ENCOUNTER — Ambulatory Visit (HOSPITAL_COMMUNITY): Payer: BC Managed Care – PPO | Attending: Internal Medicine

## 2011-03-14 ENCOUNTER — Ambulatory Visit (INDEPENDENT_AMBULATORY_CARE_PROVIDER_SITE_OTHER): Payer: BC Managed Care – PPO | Admitting: Internal Medicine

## 2011-03-14 ENCOUNTER — Encounter: Payer: Self-pay | Admitting: Internal Medicine

## 2011-03-14 VITALS — BP 126/82 | HR 70 | Temp 98.7°F | Wt 180.8 lb

## 2011-03-14 DIAGNOSIS — R059 Cough, unspecified: Secondary | ICD-10-CM

## 2011-03-14 DIAGNOSIS — R05 Cough: Secondary | ICD-10-CM

## 2011-03-14 MED ORDER — DOXYCYCLINE HYCLATE 100 MG PO TABS: 100.0000 mg | ORAL_TABLET | Freq: Two times a day (BID) | ORAL | Status: AC

## 2011-03-14 MED ORDER — HYDROCODONE-HOMATROPINE 5-1.5 MG/5ML PO SYRP: 5.0000 mL | ORAL_SOLUTION | Freq: Every evening | ORAL | Status: AC | PRN

## 2011-03-14 NOTE — Patient Instructions (Signed)
Rest, fluids , tylenol For cough, take Mucinex DM twice a day as needed  If the cough is severe at night, use hydrocodone Take the antibiotic as prescribed ----> doxycycline Call if no better in few days Call anytime if the symptoms are severe

## 2011-03-14 NOTE — Progress Notes (Signed)
  Subjective:    Patient ID: Sylvia Mcintosh, female    DOB: October 21, 1955, 55 y.o.   MRN: [id number]  HPI Sick x 2 weeks initially with runny nose , postnasal dripping, currently her only symptom is cough, dry, worse at night. Has taken over-the-counter medicines with mild relief. Has taking a "prescribed cough medicine" which works better   Past Medical History: SBE PROPHYLAXIS PREVIOUSLY  REQUIRED as per [employer] physicians for  MVP RIGHT femoral bruit non specific ST changes with negative stress test 2004 elevated homocysteine level  Allergic rhinitis  Past Surgical History: Tubal ligation ; reversed gravid 6 para 2 miscarriage 4(tubal pregnancies times two); Bunionectomy & hammer toe surgery bilat; Oral Surgery (implants) ; No Colonoscopy to date [facility] reviewed)  Review of Systems No fever chills No wheezing Mild sore throat and ear ache. No GERD-type symptoms    Objective:   Physical Exam  Constitutional: She appears well-developed and well-nourished. No distress.  HENT:  Head: Normocephalic and atraumatic.  Nose: Nose normal.       Asymmetric, nontender to palpation. Throat without redness  Cardiovascular: Normal rate, regular rhythm and normal heart sounds.   No murmur heard. Pulmonary/Chest: Effort normal and breath sounds normal. No respiratory distress. She has no wheezes. She has no rales.  Skin: She is not diaphoretic.       Assessment & Plan:  Cough: Persisting cough after a URI, possible bronchitis. See  instructions

## 2011-03-28 ENCOUNTER — Ambulatory Visit (HOSPITAL_COMMUNITY): Payer: BC Managed Care – PPO

## 2011-04-06 ENCOUNTER — Telehealth: Payer: Self-pay | Admitting: *Deleted

## 2011-04-06 MED ORDER — DOXYCYCLINE HYCLATE 100 MG PO TABS: 100.0000 mg | ORAL_TABLET | Freq: Two times a day (BID) | ORAL | Status: AC

## 2011-04-06 NOTE — Telephone Encounter (Signed)
Doxycycline 100 mg twice a day; dispense 14; office visit if no better

## 2011-04-06 NOTE — Telephone Encounter (Signed)
Pt reports that she was seen 3 wks ago & prescribed ABX for Bronchitis and now her symptoms have returned. Pt would like to know OV or new Rx.?

## 2011-04-06 NOTE — Telephone Encounter (Signed)
Informed patient

## 2011-04-24 ENCOUNTER — Ambulatory Visit (INDEPENDENT_AMBULATORY_CARE_PROVIDER_SITE_OTHER): Payer: BC Managed Care – PPO | Admitting: Internal Medicine

## 2011-04-24 ENCOUNTER — Encounter: Payer: Self-pay | Admitting: Internal Medicine

## 2011-04-24 VITALS — BP 126/88 | HR 98 | Temp 98.2°F | Wt 174.0 lb

## 2011-04-24 DIAGNOSIS — R5383 Other fatigue: Secondary | ICD-10-CM

## 2011-04-24 DIAGNOSIS — R209 Unspecified disturbances of skin sensation: Secondary | ICD-10-CM

## 2011-04-24 DIAGNOSIS — R202 Paresthesia of skin: Secondary | ICD-10-CM

## 2011-04-24 DIAGNOSIS — R5381 Other malaise: Secondary | ICD-10-CM

## 2011-04-24 DIAGNOSIS — R55 Syncope and collapse: Secondary | ICD-10-CM

## 2011-04-24 NOTE — Progress Notes (Signed)
Subjective:    Patient ID: Sylvia Mcintosh, female    DOB: 1956/04/06, 56 y.o.   MRN: [id number]  HPI FATIGUE Onset: fatigue is chronic for . 5 years , but worse since 04/18/11 after R hand & R foot changes. The right hand became numb and tingled with subsequent pain and purplish color change for 15 minutes while walking. The right foot also had color change without pain. She had near syncope 1/11in context of prolonged fasting   Primarily physical fatigue: no Symptoms: Fever/ chills : no  Night sweats: yes, since Weds                                                                                            Vision changes ( blurred/ double/ loss): no                                                                                                 Hoarseness or swallowing dysfunction: no                                                                                         Bowel changes( constipation/ diarrhea):no                                                                                    Weight change: no   Exertional chest pain: no  Dyspnea on exertion: no  Cough: no  Hemoptysis: no  New medications: no  Leg swelling: no  Orthopnea: no  PND: no  Melena/ rectal bleeding: no  Adenopathy: no  Severe snoring: no  Daytime sleepiness: yes, due to insomnia  Skin / hair / nail changes: yes, she noted some wrinkling of the tissues of the right hand with tingling 1/10  Temperature intolerance( heat/ cold) : ? Hot flashes  Feeling depressed: no  Anhedonia: no  Altered appetite: no  Poor sleep/ Apnea :insomnia with above. She falls asleep easily but wakes frequently. She is also having nocturia Abnormal bruising / bleeding or enlarged lymph nodes: no    Family history: 2 sisters have borderline diabetes                                                                           Review of  Systems she has noted increased frequency of urine and increased thirst.  She describes some cramping in her feet. She takes vitamin D irregularly.     Objective:   Physical Exam Gen.: Healthy and well-nourished in appearance. Alert, appropriate and cooperative throughout exam. Head: Normocephalic without obvious abnormalities  Eyes: No corneal or conjunctival inflammation noted. Arcus senilis. Extraocular motion intact. Field of Vision grossly normal. Mouth: Oral mucosa and oropharynx reveal no lesions or exudates. Teeth in good repair. Neck: No deformities, masses, or tenderness noted. Range of motion &  Thyroid normal. Lungs: Normal respiratory effort; chest expands symmetrically. Lungs are clear to auscultation without rales, wheezes, or increased work of breathing. Heart: Normal rate and rhythm. Normal S1 and S2. No gallop, click, or rub. Grade 1/2 -1 over 6  LSB systolic murmur                                                                                   Musculoskeletal/extremities:  No clubbing, cyanosis, edema, or deformity noted. .Tone & strength  normal.Joints normal. Nail health  good. Vascular: Carotid, radial artery, dorsalis pedis and  posterior tibial pulses are full and equal. No bruits present. Neurologic: Alert and oriented x3. Deep tendon reflexes symmetrical and normal. Cranial nerve exam is normal. Gait is normal including tiptoe and heel walking. Equivocally  positive right Tinel's sign     Skin: Intact without suspicious lesions or rashes. Lymph: No cervical, axillary lymphadenopathy present. Psych: Mood and affect are normal. Normally interactive                                                                                         Assessment & Plan:   #1 near syncope in the context of prolonged fasting. The most likely etiology was marked hypoglycemia  #2 fatigue, chronic  #3 numbness, tingling, pain, and color change in the extremities. This may  represent carpal tunnel syndrome and tarsal tunnel syndrome variant. There is no evidence of peripheral vascular disease or cardioembolic phenomena. There is no history of Raynaud's. Remote possibility is TIA phenomena.  Plan: See orders  and recommendations

## 2011-04-24 NOTE — Patient Instructions (Signed)
Please keep a diary of symptoms and signs. Enter significant symptoms, frequency, severity ( 1-10 scale), possible triggers, and response to medication, exercise or other therapeutic options as discussed. Please take enteric-coated aspirin 81 mg daily with breakfast.

## 2011-04-25 LAB — HEPATIC FUNCTION PANEL
ALT: 15 U/L (ref 0–35)
AST: 20 U/L (ref 0–37)
Alkaline Phosphatase: 58 U/L (ref 39–117)

## 2011-04-25 LAB — BASIC METABOLIC PANEL
Creatinine, Ser: 1.2 mg/dL (ref 0.4–1.2)
GFR: 60.38 mL/min (ref >60.00)
Sodium: 141 mEq/L (ref 135–145)

## 2011-04-25 LAB — CBC WITH DIFFERENTIAL/PLATELET
Basophils Absolute: 0.1 10*3/uL (ref 0.0–0.1)
HCT: 39.6 % (ref 36.0–46.0)
Hemoglobin: 13 g/dL (ref 12.0–15.0)
MCHC: 32.9 g/dL (ref 30.0–36.0)
Platelets: 177 10*3/uL (ref 150.0–400.0)
RBC: 4.36 Mil/uL (ref 3.87–5.11)
WBC: 6.5 10*3/uL (ref 4.5–10.5)

## 2011-04-25 LAB — T4, FREE: Free T4: 0.81 ng/dL (ref 0.60–1.60)

## 2011-04-25 LAB — TSH: TSH: 1.56 u[IU]/mL (ref 0.35–5.50)

## 2011-04-25 LAB — T3, FREE: T3, Free: 2.7 pg/mL (ref 2.3–4.2)

## 2011-05-01 ENCOUNTER — Ambulatory Visit (HOSPITAL_COMMUNITY)
Admission: RE | Admit: 2011-05-01 | Discharge: 2011-05-01 | Disposition: A | Discharge status: Home / Self Care | Payer: BC Managed Care – PPO | Source: Ambulatory Visit | Attending: Internal Medicine | Admitting: Internal Medicine

## 2011-05-01 DIAGNOSIS — Z1231 Encounter for screening mammogram for malignant neoplasm of breast: Secondary | ICD-10-CM

## 2011-05-02 ENCOUNTER — Other Ambulatory Visit: Payer: Self-pay | Admitting: Internal Medicine

## 2011-05-02 DIAGNOSIS — R2230 Localized swelling, mass and lump, unspecified upper limb: Secondary | ICD-10-CM

## 2011-05-11 ENCOUNTER — Ambulatory Visit
Admission: RE | Admit: 2011-05-11 | Discharge: 2011-05-11 | Disposition: A | Discharge status: Home / Self Care | Payer: BC Managed Care – PPO | Source: Ambulatory Visit | Attending: Internal Medicine | Admitting: Internal Medicine

## 2011-05-11 ENCOUNTER — Other Ambulatory Visit: Payer: Self-pay | Admitting: Internal Medicine

## 2011-05-11 DIAGNOSIS — R2230 Localized swelling, mass and lump, unspecified upper limb: Secondary | ICD-10-CM

## 2011-06-07 ENCOUNTER — Encounter: Payer: Self-pay | Admitting: Gynecology

## 2011-06-07 DIAGNOSIS — M8580 Other specified disorders of bone density and structure, unspecified site: Secondary | ICD-10-CM | POA: Insufficient documentation

## 2011-06-08 ENCOUNTER — Encounter: Payer: Self-pay | Admitting: Gynecology

## 2011-06-08 ENCOUNTER — Ambulatory Visit (INDEPENDENT_AMBULATORY_CARE_PROVIDER_SITE_OTHER): Payer: BC Managed Care – PPO | Admitting: Gynecology

## 2011-06-08 VITALS — BP 110/70 | Ht 65.0 in | Wt 177.0 lb

## 2011-06-08 DIAGNOSIS — M8580 Other specified disorders of bone density and structure, unspecified site: Secondary | ICD-10-CM

## 2011-06-08 DIAGNOSIS — Z01419 Encounter for gynecological examination (general) (routine) without abnormal findings: Secondary | ICD-10-CM

## 2011-06-08 DIAGNOSIS — M899 Disorder of bone, unspecified: Secondary | ICD-10-CM

## 2011-06-08 NOTE — Patient Instructions (Signed)
Follow up for bone density 

## 2011-06-08 NOTE — Progress Notes (Signed)
Sylvia Mcintosh Dec 10, 1955 [id number]        56 y.o.  for annual exam.  Doing well no complaints.  Past medical history,surgical history, medications, allergies, family history and social history were all reviewed and documented in the EPIC chart. ROS:  Was performed and pertinent positives and negatives are included in the history.  Exam: Kim chaperone present Filed Vitals:   06/08/11 1407  BP: 110/70   General appearance  Normal Skin grossly normal Head/Neck normal with no cervical or supraclavicular adenopathy thyroid normal Lungs  clear Cardiac RR, without RMG Abdominal  soft, nontender, without masses, organomegaly or hernia Breasts  examined lying and sitting without masses, retractions, discharge or axillary adenopathy. Pelvic  Ext/BUS/vagina  normal with mild atrophic changes  Cervix  normal    Uterus  anteverted, normal size, shape and contour, midline and mobile nontender   Adnexa  Without masses or tenderness    Anus and perineum  normal   Rectovaginal  normal sphincter tone without palpated masses or tenderness.    Assessment/Plan:  56 y.o. female for annual exam.    1. Osteopenia. Patient has history of osteopenia, is due for a two-year interval bone density now. Patient will schedule this, follow up with me and we'll go from there. 2. A Pap smear. No Pap smear was done today. Her last Pap smear was 2012. She has no history of any abnormal Pap smears with multiple normal records in her chart. I reviewed current screening guidelines we'll plan on every 3 your Pap smear.  3. Mammography. Patient had her mammogram last month we'll continue with annual mammography. SBE monthly reviewed. 4. Colonoscopy. Patient's colonoscopy was 2011. She'll follow up with them per their recommended interval. 5. Health maintenance. No blood work was done today was all done through her primary physician's office. Patient will follow up for DEXA and then annually with me.    [name], 2:34 PM 06/08/2011

## 2011-07-04 ENCOUNTER — Ambulatory Visit (INDEPENDENT_AMBULATORY_CARE_PROVIDER_SITE_OTHER): Payer: BC Managed Care – PPO

## 2011-07-04 DIAGNOSIS — M899 Disorder of bone, unspecified: Secondary | ICD-10-CM

## 2011-07-04 DIAGNOSIS — M8580 Other specified disorders of bone density and structure, unspecified site: Secondary | ICD-10-CM

## 2011-07-04 DIAGNOSIS — M949 Disorder of cartilage, unspecified: Secondary | ICD-10-CM

## 2011-07-05 ENCOUNTER — Encounter: Payer: Self-pay | Admitting: Gynecology

## 2012-06-09 ENCOUNTER — Encounter: Payer: Self-pay | Admitting: Gynecology

## 2012-06-09 ENCOUNTER — Ambulatory Visit (INDEPENDENT_AMBULATORY_CARE_PROVIDER_SITE_OTHER): Payer: BC Managed Care – PPO | Admitting: Gynecology

## 2012-06-09 VITALS — BP 120/74 | Ht 65.0 in | Wt 187.0 lb

## 2012-06-09 DIAGNOSIS — Z01419 Encounter for gynecological examination (general) (routine) without abnormal findings: Secondary | ICD-10-CM

## 2012-06-09 NOTE — Progress Notes (Signed)
Sylvia Mcintosh April 29, 1955 [id number]        57 y.o.  [id number] for annual exam.  Doing well without complaints.  Past medical history,surgical history, medications, allergies, family history and social history were all reviewed and documented in the EPIC chart. ROS:  Was performed and pertinent positives and negatives are included in the history.  Exam: Kim assistant Filed Vitals:   06/09/12 1334  BP: 120/74  Height: 5\' 5"  (1.651 m)  Weight: 162 lb (73.483 kg)   General appearance  Normal Skin grossly normal Head/Neck normal with no cervical or supraclavicular adenopathy thyroid normal Lungs  clear Cardiac RR, without RMG Abdominal  soft, nontender, without masses, organomegaly or hernia Breasts  examined lying and sitting without masses, retractions, discharge or axillary adenopathy. Pelvic  Ext/BUS/vagina  normal with mild atrophic changes  Cervix  normal with mild atrophic changes  Uterus  anteverted, normal size, shape and contour, midline and mobile nontender   Adnexa  Without masses or tenderness    Anus and perineum  normal   Rectovaginal  normal sphincter tone without palpated masses or tenderness.    Assessment/Plan:  57 y.o. [id number] female for annual exam.   1. Postmenopausal. Doing well without bleeding or significant symptoms such as hot flashes night sweats or vaginal dryness/dyspareunia. We'll continue to monitor. Knows to report any bleeding. 2. Osteopenia.  DEXA 06/2011 with T score -1.3 stable over followup scans. Recommend repeat at 5 year interval. Increase calcium vitamin D reviewed. Asked her to have vitamin D level drawn when she has her blood work done at Dr. [facility] office. 3. Pap smear 2012. No Pap smear done today. No history of abnormal Pap smears. Plan repeat Pap smear next year at 3 year interval. 4. Mammography 2013. Due now she knows to schedule. SBE monthly reviewed. 5. Colonoscopy 2011. Followup at their recommend interval. 6. Health maintenance.  No blood work done as it is all done through Dr. Alwyn Ren his office who she sees on a regular basis. Followup one year, sooner as needed.    [name], 1:59 PM 06/09/2012

## 2012-06-09 NOTE — Patient Instructions (Signed)
Follow up in one year for annual exam 

## 2012-06-10 LAB — URINALYSIS W MICROSCOPIC + REFLEX CULTURE
Bilirubin Urine: NEGATIVE
Casts: NONE SEEN
Crystals: NONE SEEN
Glucose, UA: NEGATIVE mg/dL
Protein, ur: NEGATIVE mg/dL
Squamous Epithelial / LPF: NONE SEEN

## 2012-08-19 ENCOUNTER — Encounter: Payer: Self-pay | Admitting: Internal Medicine

## 2012-08-19 ENCOUNTER — Ambulatory Visit (INDEPENDENT_AMBULATORY_CARE_PROVIDER_SITE_OTHER): Payer: BC Managed Care – PPO | Admitting: Internal Medicine

## 2012-08-19 VITALS — BP 116/70 | HR 68 | Temp 97.9°F | Resp 12 | Ht 66.03 in | Wt 186.2 lb

## 2012-08-19 DIAGNOSIS — J302 Other seasonal allergic rhinitis: Secondary | ICD-10-CM

## 2012-08-19 DIAGNOSIS — J309 Allergic rhinitis, unspecified: Secondary | ICD-10-CM

## 2012-08-19 DIAGNOSIS — M949 Disorder of cartilage, unspecified: Secondary | ICD-10-CM

## 2012-08-19 DIAGNOSIS — Z0000 Encounter for general adult medical examination without abnormal findings: Secondary | ICD-10-CM

## 2012-08-19 DIAGNOSIS — M8580 Other specified disorders of bone density and structure, unspecified site: Secondary | ICD-10-CM

## 2012-08-19 DIAGNOSIS — M899 Disorder of bone, unspecified: Secondary | ICD-10-CM

## 2012-08-19 LAB — CBC WITH DIFFERENTIAL/PLATELET
Eosinophils Relative: 0.7 % (ref 0.0–5.0)
Lymphocytes Relative: 41.5 % (ref 12.0–46.0)
Monocytes Relative: 4.6 % (ref 3.0–12.0)
Neutrophils Relative %: 52.8 % (ref 43.0–77.0)
Platelets: 189 10*3/uL (ref 150.0–400.0)
WBC: 6.8 10*3/uL (ref 4.5–10.5)

## 2012-08-19 LAB — BASIC METABOLIC PANEL
BUN: 12 mg/dL (ref 6–23)
Calcium: 9.1 mg/dL (ref 8.4–10.5)
Creatinine, Ser: 0.9 mg/dL (ref 0.4–1.2)
GFR: 79.64 mL/min (ref >60.00)
Potassium: 3.9 mEq/L (ref 3.5–5.1)

## 2012-08-19 LAB — HEPATIC FUNCTION PANEL
ALT: 15 U/L (ref 0–35)
AST: 18 U/L (ref 0–37)
Bilirubin, Direct: 0 mg/dL (ref 0.0–0.3)
Total Bilirubin: 0.6 mg/dL (ref 0.3–1.2)

## 2012-08-19 LAB — LIPID PANEL
LDL Cholesterol: 62 mg/dL (ref 0–99)
Total CHOL/HDL Ratio: 2
Triglycerides: 42 mg/dL (ref 0.0–149.0)
VLDL: 8.4 mg/dL (ref 0.0–40.0)

## 2012-08-19 NOTE — Patient Instructions (Addendum)

## 2012-08-19 NOTE — Progress Notes (Signed)
  Subjective:    Patient ID: Sylvia Mcintosh, female    DOB: [birth year], 57 y.o.   MRN: [id number]  HPI  Sylvia Mcintosh is here for a physical; she denies acute issues .     Review of Systems She is on a modified  heart healthy diet; she exercises as walking>60-90 minutes 3 times per week without symptoms. Specifically she denies chest pain, palpitations, dyspnea, or claudication. Family history is negative for premature coronary disease.     Objective:   Physical Exam Gen.: Healthy and well-nourished in appearance. Alert, appropriate and cooperative throughout exam.  Head: Normocephalic without obvious abnormalities  Eyes: No corneal or conjunctival inflammation noted. Gray pigment peripherally.  Extraocular motion intact. Vision grossly normal without lenses Ears: External  ear exam reveals no significant lesions or deformities. Wax on R.LTM normal. Hearing is grossly normal bilaterally. Nose: External nasal exam reveals no deformity or inflammation. Nasal mucosa are pink and moist. No lesions or exudates noted.  Mouth: Oral mucosa and oropharynx reveal no lesions or exudates. Teeth in good repair. Neck: No deformities, masses, or tenderness noted. Range of motion & Thyroid normal. Lungs: Normal respiratory effort; chest expands symmetrically. Lungs are clear to auscultation without rales, wheezes, or increased work of breathing. Heart: Normal rate and rhythm. Normal S1 and S2. No gallop, click, or rub. No murmur. Abdomen: Bowel sounds normal; abdomen soft and nontender. No masses, organomegaly or hernias noted. Genitalia: As per Gyn                                  Musculoskeletal/extremities: No deformity or scoliosis noted of  the thoracic or lumbar spine.  No clubbing, cyanosis, edema, or significant extremity  deformity noted. Range of motion normal .Mild crepitus knees.Tone & strength  Normal. Joints normal . Nail health good. Able to lie down & sit up w/o help. Negative SLR  bilaterally Vascular: Carotid, radial artery, dorsalis pedis and  posterior tibial pulses are full and equal. No bruits present. Neurologic: Alert and oriented x3. Deep tendon reflexes symmetrical and normal.        Skin: Intact without suspicious lesions or rashes. Lymph: No cervical, axillary lymphadenopathy present. Psych: Mood and affect are normal. Normally interactive                                                                                        Assessment & Plan:  #1 comprehensive physical exam; no acute findings  Plan: see Orders  & Recommendations

## 2012-08-23 LAB — VITAMIN D 1,25 DIHYDROXY
Vitamin D 1, 25 (OH)2 Total: 56 pg/mL (ref 18–72)
Vitamin D3 1, 25 (OH)2: 56 pg/mL

## 2012-10-13 ENCOUNTER — Other Ambulatory Visit: Payer: Self-pay

## 2012-10-13 DIAGNOSIS — Z1231 Encounter for screening mammogram for malignant neoplasm of breast: Secondary | ICD-10-CM

## 2012-10-29 ENCOUNTER — Ambulatory Visit
Admission: RE | Admit: 2012-10-29 | Discharge: 2012-10-29 | Disposition: A | Discharge status: Home / Self Care | Payer: BC Managed Care – PPO | Source: Ambulatory Visit

## 2012-10-29 DIAGNOSIS — Z1231 Encounter for screening mammogram for malignant neoplasm of breast: Secondary | ICD-10-CM

## 2013-02-12 ENCOUNTER — Other Ambulatory Visit: Payer: Self-pay

## 2013-06-17 ENCOUNTER — Telehealth: Payer: Self-pay | Admitting: *Deleted

## 2013-06-17 NOTE — Telephone Encounter (Signed)
Patient called and stated that she is having problems with seasonal allergies. Please advise. JG//CMA

## 2013-06-17 NOTE — Telephone Encounter (Signed)
Plain Mucinex (NOT D) for thick secretions ;force NON dairy fluids .   Flonase OR Nasacort AQ 1 spray in each nostril twice a day as needed. Use the "crossover" technique into opposite nostril spraying toward opposite ear @ 45 degree angle, not straight up into nostril.  Use a Neti pot daily only  as needed for significant sinus congestion; going from open side to congested side . Plain Allegra (NOT D )  160 daily , Loratidine 10 mg , OR Zyrtec 10 mg @ bedtime  as needed for itchy eyes & sneezing. OV if no better

## 2013-06-19 NOTE — Telephone Encounter (Signed)
Attempted to phone patient to relay MD response & instructions; no answer.

## 2013-06-22 ENCOUNTER — Encounter: Payer: Self-pay | Admitting: Gynecology

## 2013-06-23 NOTE — Telephone Encounter (Signed)
Spoke with the pt and she stated that she was doing better.  She spoke with someone at the pharmacy and they suggested somethings OTC.  Pt stated that she was taking Ibuprofen,flonase, and sinus med.   She stated that she will continue to take these things,but she will get the Allegra and take it.//AB/CMA

## 2013-07-15 ENCOUNTER — Encounter: Payer: Self-pay | Admitting: Gynecology

## 2013-10-02 ENCOUNTER — Ambulatory Visit (INDEPENDENT_AMBULATORY_CARE_PROVIDER_SITE_OTHER): Payer: BC Managed Care – PPO | Admitting: Gynecology

## 2013-10-02 ENCOUNTER — Encounter: Payer: Self-pay | Admitting: Gynecology

## 2013-10-02 ENCOUNTER — Other Ambulatory Visit (HOSPITAL_COMMUNITY)
Admission: RE | Admit: 2013-10-02 | Discharge: 2013-10-02 | Disposition: A | Discharge status: Home / Self Care | Payer: BC Managed Care – PPO | Source: Ambulatory Visit | Attending: Gynecology | Admitting: Gynecology

## 2013-10-02 VITALS — BP 124/80 | Ht 66.0 in | Wt 198.0 lb

## 2013-10-02 DIAGNOSIS — Z01419 Encounter for gynecological examination (general) (routine) without abnormal findings: Secondary | ICD-10-CM | POA: Insufficient documentation

## 2013-10-02 DIAGNOSIS — N952 Postmenopausal atrophic vaginitis: Secondary | ICD-10-CM

## 2013-10-02 DIAGNOSIS — M8580 Other specified disorders of bone density and structure, unspecified site: Secondary | ICD-10-CM

## 2013-10-02 DIAGNOSIS — M899 Disorder of bone, unspecified: Secondary | ICD-10-CM

## 2013-10-02 DIAGNOSIS — Z1151 Encounter for screening for human papillomavirus (HPV): Secondary | ICD-10-CM | POA: Insufficient documentation

## 2013-10-02 DIAGNOSIS — M949 Disorder of cartilage, unspecified: Secondary | ICD-10-CM

## 2013-10-02 NOTE — Patient Instructions (Signed)
Followup in one year for annual exam, sooner if any issues.  You may obtain a copy of any labs that were done today by logging onto MyChart as outlined in the instructions provided with your AVS (after visit summary). The office will not call with normal lab results but certainly if there are any significant abnormalities then we will contact you.   Health Maintenance, Female A healthy lifestyle and preventative care can promote health and wellness.  Maintain regular health, dental, and eye exams.  Eat a healthy diet. Foods like vegetables, fruits, whole grains, low-fat dairy products, and lean protein foods contain the nutrients you need without too many calories. Decrease your intake of foods high in solid fats, added sugars, and salt. Get information about a proper diet from your caregiver, if necessary.  Regular physical exercise is one of the most important things you can do for your health. Most adults should get at least 150 minutes of moderate-intensity exercise (any activity that increases your heart rate and causes you to sweat) each week. In addition, most adults need muscle-strengthening exercises on 2 or more days a week.   Maintain a healthy weight. The body mass index (BMI) is a screening tool to identify possible weight problems. It provides an estimate of body fat based on height and weight. Your caregiver can help determine your BMI, and can help you achieve or maintain a healthy weight. For adults 20 years and older:  A BMI below 18.5 is considered underweight.  A BMI of 18.5 to 24.9 is normal.  A BMI of 25 to 29.9 is considered overweight.  A BMI of 30 and above is considered obese.  Maintain normal blood lipids and cholesterol by exercising and minimizing your intake of saturated fat. Eat a balanced diet with plenty of fruits and vegetables. Blood tests for lipids and cholesterol should begin at age 20 and be repeated every 5 years. If your lipid or cholesterol levels are  high, you are over 50, or you are a high risk for heart disease, you may need your cholesterol levels checked more frequently.Ongoing high lipid and cholesterol levels should be treated with medicines if diet and exercise are not effective.  If you smoke, find out from your caregiver how to quit. If you do not use tobacco, do not start.  Lung cancer screening is recommended for adults aged 55 80 years who are at high risk for developing lung cancer because of a history of smoking. Yearly low-dose computed tomography (CT) is recommended for people who have at least a 30-pack-year history of smoking and are a current smoker or have quit within the past 15 years. A pack year of smoking is smoking an average of 1 pack of cigarettes a day for 1 year (for example: 1 pack a day for 30 years or 2 packs a day for 15 years). Yearly screening should continue until the smoker has stopped smoking for at least 15 years. Yearly screening should also be stopped for people who develop a health problem that would prevent them from having lung cancer treatment.  If you are pregnant, do not drink alcohol. If you are breastfeeding, be very cautious about drinking alcohol. If you are not pregnant and choose to drink alcohol, do not exceed 1 drink per day. One drink is considered to be 12 ounces (355 mL) of beer, 5 ounces (148 mL) of wine, or 1.5 ounces (44 mL) of liquor.  Avoid use of street drugs. Do not share needles with   anyone. Ask for help if you need support or instructions about stopping the use of drugs.  High blood pressure causes heart disease and increases the risk of stroke. Blood pressure should be checked at least every 1 to 2 years. Ongoing high blood pressure should be treated with medicines, if weight loss and exercise are not effective.  If you are 55 to 56 years old, ask your caregiver if you should take aspirin to prevent strokes.  Diabetes screening involves taking a blood sample to check your fasting  blood sugar level. This should be done once every 3 years, after age 45, if you are within normal weight and without risk factors for diabetes. Testing should be considered at a younger age or be carried out more frequently if you are overweight and have at least 1 risk factor for diabetes.  Breast cancer screening is essential preventative care for women. You should practice "breast self-awareness." This means understanding the normal appearance and feel of your breasts and may include breast self-examination. Any changes detected, no matter how small, should be reported to a caregiver. Women in their 20s and 30s should have a clinical breast exam (CBE) by a caregiver as part of a regular health exam every 1 to 3 years. After age 40, women should have a CBE every year. Starting at age 40, women should consider having a mammogram (breast X-ray) every year. Women who have a family history of breast cancer should talk to their caregiver about genetic screening. Women at a high risk of breast cancer should talk to their caregiver about having an MRI and a mammogram every year.  Breast cancer gene (BRCA)-related cancer risk assessment is recommended for women who have family members with BRCA-related cancers. BRCA-related cancers include breast, ovarian, tubal, and peritoneal cancers. Having family members with these cancers may be associated with an increased risk for harmful changes (mutations) in the breast cancer genes BRCA1 and BRCA2. Results of the assessment will determine the need for genetic counseling and BRCA1 and BRCA2 testing.  The Pap test is a screening test for cervical cancer. Women should have a Pap test starting at age 21. Between ages 21 and 29, Pap tests should be repeated every 2 years. Beginning at age 30, you should have a Pap test every 3 years as long as the past 3 Pap tests have been normal. If you had a hysterectomy for a problem that was not cancer or a condition that could lead to  cancer, then you no longer need Pap tests. If you are between ages 65 and 70, and you have had normal Pap tests going back 10 years, you no longer need Pap tests. If you have had past treatment for cervical cancer or a condition that could lead to cancer, you need Pap tests and screening for cancer for at least 20 years after your treatment. If Pap tests have been discontinued, risk factors (such as a new sexual partner) need to be reassessed to determine if screening should be resumed. Some women have medical problems that increase the chance of getting cervical cancer. In these cases, your caregiver may recommend more frequent screening and Pap tests.  The human papillomavirus (HPV) test is an additional test that may be used for cervical cancer screening. The HPV test looks for the virus that can cause the cell changes on the cervix. The cells collected during the Pap test can be tested for HPV. The HPV test could be used to screen women aged 30   years and older, and should be used in women of any age who have unclear Pap test results. After the age of 30, women should have HPV testing at the same frequency as a Pap test.  Colorectal cancer can be detected and often prevented. Most routine colorectal cancer screening begins at the age of 50 and continues through age 75. However, your caregiver may recommend screening at an earlier age if you have risk factors for colon cancer. On a yearly basis, your caregiver may provide home test kits to check for hidden blood in the stool. Use of a small camera at the end of a tube, to directly examine the colon (sigmoidoscopy or colonoscopy), can detect the earliest forms of colorectal cancer. Talk to your caregiver about this at age 50, when routine screening begins. Direct examination of the colon should be repeated every 5 to 10 years through age 75, unless early forms of pre-cancerous polyps or small growths are found.  Hepatitis C blood testing is recommended for  all people born from 1945 through 1965 and any individual with known risks for hepatitis C.  Practice safe sex. Use condoms and avoid high-risk sexual practices to reduce the spread of sexually transmitted infections (STIs). Sexually active women aged 25 and younger should be checked for Chlamydia, which is a common sexually transmitted infection. Older women with new or multiple partners should also be tested for Chlamydia. Testing for other STIs is recommended if you are sexually active and at increased risk.  Osteoporosis is a disease in which the bones lose minerals and strength with aging. This can result in serious bone fractures. The risk of osteoporosis can be identified using a bone density scan. Women ages 65 and over and women at risk for fractures or osteoporosis should discuss screening with their caregivers. Ask your caregiver whether you should be taking a calcium supplement or vitamin D to reduce the rate of osteoporosis.  Menopause can be associated with physical symptoms and risks. Hormone replacement therapy is available to decrease symptoms and risks. You should talk to your caregiver about whether hormone replacement therapy is right for you.  Use sunscreen. Apply sunscreen liberally and repeatedly throughout the day. You should seek shade when your shadow is shorter than you. Protect yourself by wearing long sleeves, pants, a wide-brimmed hat, and sunglasses year round, whenever you are outdoors.  Notify your caregiver of new moles or changes in moles, especially if there is a change in shape or color. Also notify your caregiver if a mole is larger than the size of a pencil eraser.  Stay current with your immunizations. Document Released: 10/09/2010 Document Revised: 07/21/2012 Document Reviewed: 10/09/2010 [employer] Patient Information 2014 [employer].   

## 2013-10-02 NOTE — Addendum Note (Signed)
Addended by: Dayna BarkerGARDNER, KIMBERLY K on: 10/02/2013 10:48 AM   Modules accepted: Orders

## 2013-10-02 NOTE — Progress Notes (Signed)
Sylvia Mcintosh 1956/01/16 [id number]        58 y.o.  [id number] for annual exam.  Doing well. Several issues noted below.  Past medical history,surgical history, problem list, medications, allergies, family history and social history were all reviewed and documented as reviewed in the EPIC chart.  ROS:  12 system ROS performed with pertinent positives and negatives included in the history, assessment and plan.   Additional significant findings :  None   Exam: [facility] personassistant Filed Vitals:   10/02/13 1008  BP: 124/80  Height: 5\' 6"  (1.676 m)  Weight: 198 lb (89.812 kg)   General appearance:  Normal affect, orientation and appearance. Skin: Grossly normal HEENT: Without gross lesions.  No cervical or supraclavicular adenopathy. Thyroid normal.  Lungs:  Clear without wheezing, rales or rhonchi Cardiac: RR, without RMG Abdominal:  Soft, nontender, without masses, guarding, rebound, organomegaly or hernia Breasts:  Examined lying and sitting without masses, retractions, discharge or axillary adenopathy. Pelvic:  Ext/BUS/vagina with generalized atrophic changes.  Cervix with atrophic changes. Pap/HPV done  Uterus anteverted, normal size, shape and contour, midline and mobile nontender   Adnexa  Without masses or tenderness    Anus and perineum  Normal   Rectovaginal  Normal sphincter tone without palpated masses or tenderness.    Assessment/Plan:  58 y.o. [id number] female for annual exam.   1. Postmenopausal/atrophic genital changes. Without significant hot flushes or night sweats. Is having vaginal dryness with dyspareunia. Has tried OTC lubricants without benefit. Recommended trial of vegetable oil. Options to include vaginal estrogen such as Vagifem, vaginal estrogen cream, Osphena, Estring reviewed. Patient is not interested in prescription medication at this point. Will followup if continues to be an issue. No vaginal bleeding and the patient knows to report any vaginal  bleeding. 2. Osteopenia. DEXA 06/2011 T score -1.3 FRAX 2.2%/0.1% stable from prior studies. Recommend repeat five-year interval. Increase calcium vitamin D reviewed. 3. Pap smear 2012. Pap/HPV today. No history of abnormal Pap smears previously. Plan repeat Pap smear in 3-5 year interval per current screening guidelines if this one is normal. 4. Mammography 10/2012. I reminded the patient to schedule her mammogram this July. SBE monthly reviewed. 5. Colonoscopy 2012. Repeat at their recommended interval. 6. Health maintenance. No blood work done as this is done through her primary physician's office who she is in the process of setting up an appointment. Followup one year, sooner if needed.   Note: This document was prepared with digital dictation and possible smart phrase technology. Any transcriptional errors that result from this process are unintentional.   Dara LordsFONTAINE,TIMOTHY P MD, 10:28 AM 10/02/2013

## 2013-10-03 LAB — URINALYSIS W MICROSCOPIC + REFLEX CULTURE
CASTS: NONE SEEN
Crystals: NONE SEEN
Glucose, UA: NEGATIVE mg/dL
Ketones, ur: NEGATIVE mg/dL
NITRITE: NEGATIVE
PH: 5 (ref 5.0–8.0)
PROTEIN: NEGATIVE mg/dL
Specific Gravity, Urine: 1.02 (ref 1.005–1.030)
Urobilinogen, UA: NEGATIVE mg/dL (ref 0.0–1.0)

## 2013-10-05 ENCOUNTER — Other Ambulatory Visit: Payer: Self-pay | Admitting: Gynecology

## 2013-10-05 LAB — URINE CULTURE

## 2013-10-05 LAB — CYTOLOGY - PAP

## 2013-10-05 MED ORDER — SULFAMETHOXAZOLE-TMP DS 800-160 MG PO TABS: 1.0000 | ORAL_TABLET | Freq: Two times a day (BID) | ORAL | Status: DC

## 2013-10-21 ENCOUNTER — Other Ambulatory Visit: Payer: Self-pay

## 2013-10-21 DIAGNOSIS — Z1231 Encounter for screening mammogram for malignant neoplasm of breast: Secondary | ICD-10-CM

## 2013-10-28 ENCOUNTER — Ambulatory Visit: Payer: BC Managed Care – PPO | Admitting: Podiatry

## 2013-10-30 ENCOUNTER — Ambulatory Visit
Admission: RE | Admit: 2013-10-30 | Discharge: 2013-10-30 | Disposition: A | Discharge status: Home / Self Care | Payer: BC Managed Care – PPO | Source: Ambulatory Visit

## 2013-10-30 DIAGNOSIS — Z1231 Encounter for screening mammogram for malignant neoplasm of breast: Secondary | ICD-10-CM

## 2013-11-16 ENCOUNTER — Other Ambulatory Visit (INDEPENDENT_AMBULATORY_CARE_PROVIDER_SITE_OTHER): Payer: BC Managed Care – PPO

## 2013-11-16 ENCOUNTER — Encounter: Payer: Self-pay | Admitting: Internal Medicine

## 2013-11-16 ENCOUNTER — Ambulatory Visit (INDEPENDENT_AMBULATORY_CARE_PROVIDER_SITE_OTHER): Payer: BC Managed Care – PPO | Admitting: Internal Medicine

## 2013-11-16 VITALS — BP 131/76 | HR 53 | Temp 98.2°F | Ht 66.0 in | Wt 201.4 lb

## 2013-11-16 DIAGNOSIS — Z0000 Encounter for general adult medical examination without abnormal findings: Secondary | ICD-10-CM

## 2013-11-16 DIAGNOSIS — R9431 Abnormal electrocardiogram [ECG] [EKG]: Secondary | ICD-10-CM

## 2013-11-16 LAB — BASIC METABOLIC PANEL
BUN: 11 mg/dL (ref 6–23)
CO2: 30 mEq/L (ref 19–32)
Calcium: 9.1 mg/dL (ref 8.4–10.5)
Chloride: 104 mEq/L (ref 96–112)
Creatinine, Ser: 0.9 mg/dL (ref 0.4–1.2)
GFR: 81.27 mL/min (ref >60.00)
Glucose, Bld: 92 mg/dL (ref 70–99)
POTASSIUM: 4.3 meq/L (ref 3.5–5.1)
SODIUM: 139 meq/L (ref 135–145)

## 2013-11-16 LAB — HEPATIC FUNCTION PANEL
ALBUMIN: 3.7 g/dL (ref 3.5–5.2)
ALT: 14 U/L (ref 0–35)
AST: 18 U/L (ref 0–37)
Alkaline Phosphatase: 78 U/L (ref 39–117)
Bilirubin, Direct: 0.1 mg/dL (ref 0.0–0.3)
Total Bilirubin: 0.6 mg/dL (ref 0.2–1.2)
Total Protein: 7.2 g/dL (ref 6.0–8.3)

## 2013-11-16 LAB — CBC WITH DIFFERENTIAL/PLATELET
BASOS PCT: 0.2 % (ref 0.0–3.0)
Basophils Absolute: 0 10*3/uL (ref 0.0–0.1)
EOS PCT: 0.6 % (ref 0.0–5.0)
Eosinophils Absolute: 0 10*3/uL (ref 0.0–0.7)
HEMATOCRIT: 40.1 % (ref 36.0–46.0)
HEMOGLOBIN: 13.1 g/dL (ref 12.0–15.0)
LYMPHS ABS: 2.5 10*3/uL (ref 0.7–4.0)
Lymphocytes Relative: 33.5 % (ref 12.0–46.0)
MCHC: 32.6 g/dL (ref 30.0–36.0)
MCV: 88.2 fl (ref 78.0–100.0)
MONOS PCT: 4.6 % (ref 3.0–12.0)
Monocytes Absolute: 0.3 10*3/uL (ref 0.1–1.0)
NEUTROS ABS: 4.6 10*3/uL (ref 1.4–7.7)
Neutrophils Relative %: 61.1 % (ref 43.0–77.0)
Platelets: 209 10*3/uL (ref 150.0–400.0)
RBC: 4.55 Mil/uL (ref 3.87–5.11)
RDW: 15 % (ref 11.5–15.5)
WBC: 7.5 10*3/uL (ref 4.0–10.5)

## 2013-11-16 LAB — LIPID PANEL
CHOLESTEROL: 173 mg/dL (ref 0–200)
HDL: 74.1 mg/dL (ref >39.00)
LDL CALC: 85 mg/dL (ref 0–99)
NonHDL: 98.9
Total CHOL/HDL Ratio: 2
Triglycerides: 69 mg/dL (ref 0.0–149.0)
VLDL: 13.8 mg/dL (ref 0.0–40.0)

## 2013-11-16 LAB — TSH: TSH: 1.4 u[IU]/mL (ref 0.35–4.50)

## 2013-11-16 NOTE — Progress Notes (Signed)
Pre visit review using our clinic review tool, if applicable. No additional management support is needed unless otherwise documented below in the visit note. 

## 2013-11-16 NOTE — Progress Notes (Signed)
   Subjective:    Patient ID: Fuller CanadaLana Wiegel, female    DOB: 05/23/1955, 58 y.o.   MRN: [id number]  HPI She is here for a  physical;acute issues include intermittent thoracic spine discomfort with occasional L hip & inguinal discomfort.  A heart healthy diet is followed; exercise encompasses 20-30 minutes >3  times per week as  walking without symptoms.  PMH of scoliosis on [employer] exam.  Review of Systems   Fever, chills, sweats, or unexplained weight loss not present. Gait imbalance denied. There is no numbness, tingling, or weakness in extremities.   No loss of control of bladder or bowels. Radicular type pain absent. Some cramps in in feet @ night. Specifically denied are  chest pain, palpitations, dyspnea, or claudication.  Significant abdominal symptoms, memory deficit, or myalgias not present.       Objective:   Physical Exam Gen.: Healthy and well-nourished in appearance. Alert, appropriate and cooperative throughout exam. Appears younger than stated age  Head: Normocephalic without obvious abnormalities Eyes: Arcus bilaterally.No corneal or conjunctival inflammation noted. Pupils equal round reactive to light and accommodation. Extraocular motion intact. Ears: External  ear exam reveals no significant lesions or deformities. Canals clear .TMs normal. Hearing is grossly normal bilaterally. Nose: External nasal exam reveals no deformity or inflammation. Nasal mucosa are pink and moist. No lesions or exudates noted.   Mouth: Oral mucosa and oropharynx reveal no lesions or exudates. Teeth in good repair. Neck: No deformities, masses, or tenderness noted. Range of motion &Thyroid normal. Lungs: Normal respiratory effort; chest expands symmetrically. Lungs are clear to auscultation without rales, wheezes, or increased work of breathing. Heart: Normal rate and rhythm. Normal S1 and S2. No gallop, click, or rub. No murmur. Abdomen: Bowel sounds normal; abdomen soft and nontender.  No masses, organomegaly or hernias noted. Genitalia:  as per Gyn                                  Musculoskeletal/extremities: No deformity or scoliosis noted of  the thoracic or lumbar spine despite PMH of scoliosis. No clubbing, cyanosis, edema, or significant extremity  deformity noted. Range of motion normal .Tone & strength normal. Low arches. Hand joints normal  Fingernail  health good. Neuro: L knee reflex 1/2 +; R 0+. Able to lie down & sit up w/o help. Negative SLR bilaterally Vascular: Carotid, radial artery, dorsalis pedis and  posterior tibial pulses are full and equal. No bruits present. Neurologic: Alert and oriented x3. Deep tendon reflexes symmetrical and normal.  Gait normal .      Skin: Intact without suspicious lesions or rashes. Op scars @ base of great toes.Callus  @ base of soles.(R/O plantar warts vs neuroma ). Lymph: No cervical, axillary lymphadenopathy present. Psych: Mood and affect are normal. Normally interactive                                                                                      Assessment & Plan:  #1 comprehensive physical exam; no acute findings  Plan: see Orders  & Recommendations

## 2013-11-16 NOTE — Patient Instructions (Addendum)
Scoliosis is a side to side or  S-shaped curvature of the spine. This is often associated with recurrent back pain especially after repetitive motion , hyperextension or rotation due to pinching of nerve roots with resultant muscle spasm. The best exercises to prevent recurrent back pain  include freestyle swimming, stretch aerobics, and yoga. Scoliosis, unlike spinal stenosis, is not usually approached surgically except in pre adults.   Your next office appointment will be determined based upon review of your pending labs . Those instructions will be transmitted to you through My Chart  OR  by mail;whichever process is your choice to receive results & recommendations .  Followup as needed for your acute issue. Please report any significant change in your symptoms.  Please perform isometric exercises before going to bed. Sit on side of the bed and raise up on toes to a count of 5. Then put pressure on the heels to a count of 5. Repeat this process 10 times. This will improve blood flow to the calves & help prevent cramps.  Take the EKG to any emergency room or preop visits. There are nonspecific changes; as long as there is no new change these are not clinically significant . If the old EKG is not available for comparison; it may result in unnecessary hospitalization for observation with significant unnecessary expense.

## 2013-11-18 DIAGNOSIS — R9431 Abnormal electrocardiogram [ECG] [EKG]: Secondary | ICD-10-CM | POA: Insufficient documentation

## 2014-02-08 ENCOUNTER — Encounter: Payer: Self-pay | Admitting: Internal Medicine

## 2014-03-11 ENCOUNTER — Encounter: Payer: Self-pay | Admitting: Podiatry

## 2014-03-11 ENCOUNTER — Ambulatory Visit (INDEPENDENT_AMBULATORY_CARE_PROVIDER_SITE_OTHER): Payer: BC Managed Care – PPO

## 2014-03-11 ENCOUNTER — Ambulatory Visit (INDEPENDENT_AMBULATORY_CARE_PROVIDER_SITE_OTHER): Payer: BC Managed Care – PPO | Admitting: Podiatry

## 2014-03-11 VITALS — BP 121/68 | HR 73 | Resp 16

## 2014-03-11 DIAGNOSIS — M779 Enthesopathy, unspecified: Secondary | ICD-10-CM

## 2014-03-11 DIAGNOSIS — M2041 Other hammer toe(s) (acquired), right foot: Secondary | ICD-10-CM

## 2014-03-11 NOTE — Progress Notes (Signed)
   Subjective:    Patient ID: Sylvia Mcintosh, female    DOB: [birth year], 58 y.o.   MRN: [id number]  HPI Comments:   Patient c/o 2nd toe and plantar forefoot right for few months. Has callused area plantarly. Usually trims.   Toe Pain       Review of Systems  All other systems reviewed and are negative.      Objective:   Physical Exam        Assessment & Plan:

## 2014-03-11 NOTE — Progress Notes (Signed)
Subjective:     Patient ID: Sylvia Mcintosh, female   DOB: 08-19-1955, 58 y.o.   MRN: [id number]  HPI patient states I'm having a lot of problems with the second toe on my right foot and I don't remember injuring it and I also get these chronic calluses   Review of Systems  All other systems reviewed and are negative.      Objective:   Physical Exam  Constitutional: She is oriented to person, place, and time.  Cardiovascular: Intact distal pulses.   Musculoskeletal: Normal range of motion.  Neurological: She is oriented to person, place, and time.  Skin: Skin is warm.  Nursing note and vitals reviewed.  neurovascular status found to be intact with muscle strength adequate and range of motion within normal limits. Patient is noted to have good digital perfusion no equinus condition noted and is well oriented 3. Has mild elongation deformity of the second digit right with keratotic lesion second metatarsal and discomfort in the lateral aspect of the right second toe with no specific lesion associated with it     Assessment:     Possible digital strain secondary to elongation deformity along with chronic keratotic tissue formation    Plan:     H&P and condition discussed. At this point I dispensed a buttress pad and reviewed x-rays about the toe and then debrided lesions and instructed to reappoint if symptoms persist

## 2014-08-18 ENCOUNTER — Encounter: Payer: Self-pay | Admitting: Gastroenterology

## 2014-10-06 ENCOUNTER — Encounter: Payer: Self-pay | Admitting: Gynecology

## 2014-10-06 ENCOUNTER — Ambulatory Visit (INDEPENDENT_AMBULATORY_CARE_PROVIDER_SITE_OTHER): Payer: BLUE CROSS/BLUE SHIELD | Admitting: Gynecology

## 2014-10-06 VITALS — BP 124/78 | Ht 67.0 in | Wt 199.0 lb

## 2014-10-06 DIAGNOSIS — M8580 Other specified disorders of bone density and structure, unspecified site: Secondary | ICD-10-CM

## 2014-10-06 DIAGNOSIS — Z01419 Encounter for gynecological examination (general) (routine) without abnormal findings: Secondary | ICD-10-CM

## 2014-10-06 NOTE — Patient Instructions (Signed)
Follow up for your bone density as scheduled.  You may obtain a copy of any labs that were done today by logging onto MyChart as outlined in the instructions provided with your AVS (after visit summary). The office will not call with normal lab results but certainly if there are any significant abnormalities then we will contact you.   Health Maintenance, Female A healthy lifestyle and preventative care can promote health and wellness.  Maintain regular health, dental, and eye exams.  Eat a healthy diet. Foods like vegetables, fruits, whole grains, low-fat dairy products, and lean protein foods contain the nutrients you need without too many calories. Decrease your intake of foods high in solid fats, added sugars, and salt. Get information about a proper diet from your caregiver, if necessary.  Regular physical exercise is one of the most important things you can do for your health. Most adults should get at least 150 minutes of moderate-intensity exercise (any activity that increases your heart rate and causes you to sweat) each week. In addition, most adults need muscle-strengthening exercises on 2 or more days a week.   Maintain a healthy weight. The body mass index (BMI) is a screening tool to identify possible weight problems. It provides an estimate of body fat based on height and weight. Your caregiver can help determine your BMI, and can help you achieve or maintain a healthy weight. For adults 20 years and older:  A BMI below 18.5 is considered underweight.  A BMI of 18.5 to 24.9 is normal.  A BMI of 25 to 29.9 is considered overweight.  A BMI of 30 and above is considered obese.  Maintain normal blood lipids and cholesterol by exercising and minimizing your intake of saturated fat. Eat a balanced diet with plenty of fruits and vegetables. Blood tests for lipids and cholesterol should begin at age 42 and be repeated every 5 years. If your lipid or cholesterol levels are high, you are  over 50, or you are a high risk for heart disease, you may need your cholesterol levels checked more frequently.Ongoing high lipid and cholesterol levels should be treated with medicines if diet and exercise are not effective.  If you smoke, find out from your caregiver how to quit. If you do not use tobacco, do not start.  Lung cancer screening is recommended for adults aged 5 80 years who are at high risk for developing lung cancer because of a history of smoking. Yearly low-dose computed tomography (CT) is recommended for people who have at least a 30-pack-year history of smoking and are a current smoker or have quit within the past 15 years. A pack year of smoking is smoking an average of 1 pack of cigarettes a day for 1 year (for example: 1 pack a day for 30 years or 2 packs a day for 15 years). Yearly screening should continue until the smoker has stopped smoking for at least 15 years. Yearly screening should also be stopped for people who develop a health problem that would prevent them from having lung cancer treatment.  If you are pregnant, do not drink alcohol. If you are breastfeeding, be very cautious about drinking alcohol. If you are not pregnant and choose to drink alcohol, do not exceed 1 drink per day. One drink is considered to be 12 ounces (355 mL) of beer, 5 ounces (148 mL) of wine, or 1.5 ounces (44 mL) of liquor.  Avoid use of street drugs. Do not share needles with anyone. Ask for  help if you need support or instructions about stopping the use of drugs.  High blood pressure causes heart disease and increases the risk of stroke. Blood pressure should be checked at least every 1 to 2 years. Ongoing high blood pressure should be treated with medicines, if weight loss and exercise are not effective.  If you are 60 to 57 years old, ask your caregiver if you should take aspirin to prevent strokes.  Diabetes screening involves taking a blood sample to check your fasting blood sugar  level. This should be done once every 3 years, after age 61, if you are within normal weight and without risk factors for diabetes. Testing should be considered at a younger age or be carried out more frequently if you are overweight and have at least 1 risk factor for diabetes.  Breast cancer screening is essential preventative care for women. You should practice "breast self-awareness." This means understanding the normal appearance and feel of your breasts and may include breast self-examination. Any changes detected, no matter how small, should be reported to a caregiver. Women in their 25s and 30s should have a clinical breast exam (CBE) by a caregiver as part of a regular health exam every 1 to 3 years. After age 36, women should have a CBE every year. Starting at age 23, women should consider having a mammogram (breast X-ray) every year. Women who have a family history of breast cancer should talk to their caregiver about genetic screening. Women at a high risk of breast cancer should talk to their caregiver about having an MRI and a mammogram every year.  Breast cancer gene (BRCA)-related cancer risk assessment is recommended for women who have family members with BRCA-related cancers. BRCA-related cancers include breast, ovarian, tubal, and peritoneal cancers. Having family members with these cancers may be associated with an increased risk for harmful changes (mutations) in the breast cancer genes BRCA1 and BRCA2. Results of the assessment will determine the need for genetic counseling and BRCA1 and BRCA2 testing.  The Pap test is a screening test for cervical cancer. Women should have a Pap test starting at age 14. Between ages 20 and 24, Pap tests should be repeated every 2 years. Beginning at age 73, you should have a Pap test every 3 years as long as the past 3 Pap tests have been normal. If you had a hysterectomy for a problem that was not cancer or a condition that could lead to cancer, then  you no longer need Pap tests. If you are between ages 79 and 22, and you have had normal Pap tests going back 10 years, you no longer need Pap tests. If you have had past treatment for cervical cancer or a condition that could lead to cancer, you need Pap tests and screening for cancer for at least 20 years after your treatment. If Pap tests have been discontinued, risk factors (such as a new sexual partner) need to be reassessed to determine if screening should be resumed. Some women have medical problems that increase the chance of getting cervical cancer. In these cases, your caregiver may recommend more frequent screening and Pap tests.  The human papillomavirus (HPV) test is an additional test that may be used for cervical cancer screening. The HPV test looks for the virus that can cause the cell changes on the cervix. The cells collected during the Pap test can be tested for HPV. The HPV test could be used to screen women aged 43 years and older,  and should be used in women of any age who have unclear Pap test results. After the age of 43, women should have HPV testing at the same frequency as a Pap test.  Colorectal cancer can be detected and often prevented. Most routine colorectal cancer screening begins at the age of 27 and continues through age 47. However, your caregiver may recommend screening at an earlier age if you have risk factors for colon cancer. On a yearly basis, your caregiver may provide home test kits to check for hidden blood in the stool. Use of a small camera at the end of a tube, to directly examine the colon (sigmoidoscopy or colonoscopy), can detect the earliest forms of colorectal cancer. Talk to your caregiver about this at age 41, when routine screening begins. Direct examination of the colon should be repeated every 5 to 10 years through age 41, unless early forms of pre-cancerous polyps or small growths are found.  Hepatitis C blood testing is recommended for all people born  from [birth year] through 1963 and any individual with known risks for hepatitis C.  Practice safe sex. Use condoms and avoid high-risk sexual practices to reduce the spread of sexually transmitted infections (STIs). Sexually active women aged 68 and younger should be checked for Chlamydia, which is a common sexually transmitted infection. Older women with new or multiple partners should also be tested for Chlamydia. Testing for other STIs is recommended if you are sexually active and at increased risk.  Osteoporosis is a disease in which the bones lose minerals and strength with aging. This can result in serious bone fractures. The risk of osteoporosis can be identified using a bone density scan. Women ages 42 and over and women at risk for fractures or osteoporosis should discuss screening with their caregivers. Ask your caregiver whether you should be taking a calcium supplement or vitamin D to reduce the rate of osteoporosis.  Menopause can be associated with physical symptoms and risks. Hormone replacement therapy is available to decrease symptoms and risks. You should talk to your caregiver about whether hormone replacement therapy is right for you.  Use sunscreen. Apply sunscreen liberally and repeatedly throughout the day. You should seek shade when your shadow is shorter than you. Protect yourself by wearing long sleeves, pants, a wide-brimmed hat, and sunglasses year round, whenever you are outdoors.  Notify your caregiver of new moles or changes in moles, especially if there is a change in shape or color. Also notify your caregiver if a mole is larger than the size of a pencil eraser.  Stay current with your immunizations. Document Released: 10/09/2010 Document Revised: 07/21/2012 Document Reviewed: 10/09/2010 [facility] Patient Information 2014 [place].

## 2014-10-06 NOTE — Progress Notes (Signed)
Sylvia Mcintosh [birth year] [id number]        59 y.o.  [id number] for annual exam.  Several issues noted below.  Past medical history,surgical history, problem list, medications, allergies, family history and social history were all reviewed and documented as reviewed in the EPIC chart.  ROS:  Performed with pertinent positives and negatives included in the history, assessment and plan.   Additional significant findings :  none   Exam: [facility] personassistant Filed Vitals:   10/06/14 1413  BP: 124/78  Height: 5\' 7"  (1.702 m)  Weight: 199 lb (90.266 kg)   General appearance:  Normal affect, orientation and appearance. Skin: Grossly normal HEENT: Without gross lesions.  No cervical or supraclavicular adenopathy. Thyroid normal.  Lungs:  Clear without wheezing, rales or rhonchi Cardiac: RR, without RMG Abdominal:  Soft, nontender, without masses, guarding, rebound, organomegaly or hernia Breasts:  Examined lying and sitting without masses, retractions, discharge or axillary adenopathy. Pelvic:  Ext/BUS/vagina with mild atrophic changes  Cervix with mild atrophic changes  Uterus anteverted, normal size, shape and contour, midline and mobile nontender   Adnexa  Without masses or tenderness    Anus and perineum  Normal   Rectovaginal  Normal sphincter tone without palpated masses or tenderness.    Assessment/Plan:  59 y.o. [id number] female for annual exam.   1. Postmenopausal/mild atrophic changes. No significant hot flashes, night sweats, vaginal dryness or any vaginal bleeding. Continue to monitor and report any vaginal bleeding. 2. Osteopenia. DEXA 2013 T score -1.3 FRAX 2.2%/0.1%. Stable from prior study. Repeat DEXA now three-year interval. Increased calcium vitamin D reviewed. 3. Pap smear/HPV negative 2015. No Pap smear done today. No history of significant abnormal Pap smears. 4. Colonoscopy 2012. Planned repeat interval 5 years. Patient reminded to schedule next year. 5. Mammography 10/2013.  Schedule mammography later this summer. SBE monthly reviewed. 6. Health maintenance. No blood work done as this is done through her primary physician's office. Follow up 1 year, sooner as needed.   Dara LordsFONTAINE,Marvin Grabill P MD, 2:30 PM 10/06/2014

## 2014-10-07 LAB — URINALYSIS W MICROSCOPIC + REFLEX CULTURE
Bacteria, UA: NONE SEEN
Bilirubin Urine: NEGATIVE
Casts: NONE SEEN
Crystals: NONE SEEN
Glucose, UA: NEGATIVE mg/dL
Hgb urine dipstick: NEGATIVE
Leukocytes, UA: NEGATIVE
Nitrite: NEGATIVE
Protein, ur: NEGATIVE mg/dL
Specific Gravity, Urine: 1.027 (ref 1.005–1.030)
Squamous Epithelial / HPF: NONE SEEN
Urobilinogen, UA: 0.2 mg/dL (ref 0.0–1.0)
pH: 5 (ref 5.0–8.0)

## 2014-10-08 DIAGNOSIS — M8580 Other specified disorders of bone density and structure, unspecified site: Secondary | ICD-10-CM

## 2014-10-08 HISTORY — DX: Other specified disorders of bone density and structure, unspecified site: M85.80

## 2014-10-19 ENCOUNTER — Ambulatory Visit (INDEPENDENT_AMBULATORY_CARE_PROVIDER_SITE_OTHER): Payer: BLUE CROSS/BLUE SHIELD

## 2014-10-19 ENCOUNTER — Other Ambulatory Visit: Payer: Self-pay | Admitting: Gynecology

## 2014-10-19 ENCOUNTER — Encounter: Payer: Self-pay | Admitting: Gynecology

## 2014-10-19 DIAGNOSIS — M8580 Other specified disorders of bone density and structure, unspecified site: Secondary | ICD-10-CM

## 2014-10-19 DIAGNOSIS — Z13820 Encounter for screening for osteoporosis: Secondary | ICD-10-CM

## 2014-11-05 ENCOUNTER — Encounter: Payer: Self-pay | Admitting: Gynecology

## 2014-11-17 ENCOUNTER — Other Ambulatory Visit: Payer: Self-pay

## 2014-11-17 DIAGNOSIS — Z1231 Encounter for screening mammogram for malignant neoplasm of breast: Secondary | ICD-10-CM

## 2014-12-17 ENCOUNTER — Other Ambulatory Visit (INDEPENDENT_AMBULATORY_CARE_PROVIDER_SITE_OTHER): Payer: BLUE CROSS/BLUE SHIELD

## 2014-12-17 ENCOUNTER — Encounter: Payer: Self-pay | Admitting: Internal Medicine

## 2014-12-17 ENCOUNTER — Ambulatory Visit (INDEPENDENT_AMBULATORY_CARE_PROVIDER_SITE_OTHER): Payer: BLUE CROSS/BLUE SHIELD | Admitting: Internal Medicine

## 2014-12-17 VITALS — BP 112/70 | HR 63 | Temp 98.7°F | Resp 14 | Ht 67.0 in | Wt 206.0 lb

## 2014-12-17 DIAGNOSIS — Z0000 Encounter for general adult medical examination without abnormal findings: Secondary | ICD-10-CM | POA: Diagnosis not present

## 2014-12-17 DIAGNOSIS — Z0189 Encounter for other specified special examinations: Secondary | ICD-10-CM

## 2014-12-17 DIAGNOSIS — Z0100 Encounter for examination of eyes and vision without abnormal findings: Secondary | ICD-10-CM | POA: Diagnosis not present

## 2014-12-17 DIAGNOSIS — Z23 Encounter for immunization: Secondary | ICD-10-CM | POA: Diagnosis not present

## 2014-12-17 LAB — BASIC METABOLIC PANEL
BUN: 13 mg/dL (ref 6–23)
CO2: 29 mEq/L (ref 19–32)
Calcium: 9.2 mg/dL (ref 8.4–10.5)
Chloride: 105 mEq/L (ref 96–112)
Creatinine, Ser: 0.89 mg/dL (ref 0.40–1.20)
GFR: 84.11 mL/min (ref >60.00)
GLUCOSE: 94 mg/dL (ref 70–99)
POTASSIUM: 4.4 meq/L (ref 3.5–5.1)
Sodium: 141 mEq/L (ref 135–145)

## 2014-12-17 LAB — CBC WITH DIFFERENTIAL/PLATELET
BASOS PCT: 0.4 % (ref 0.0–3.0)
Basophils Absolute: 0 10*3/uL (ref 0.0–0.1)
EOS PCT: 0.5 % (ref 0.0–5.0)
Eosinophils Absolute: 0 10*3/uL (ref 0.0–0.7)
HCT: 38.8 % (ref 36.0–46.0)
Hemoglobin: 12.8 g/dL (ref 12.0–15.0)
LYMPHS ABS: 2.6 10*3/uL (ref 0.7–4.0)
Lymphocytes Relative: 35.7 % (ref 12.0–46.0)
MCHC: 33 g/dL (ref 30.0–36.0)
MCV: 87.8 fl (ref 78.0–100.0)
MONO ABS: 0.3 10*3/uL (ref 0.1–1.0)
MONOS PCT: 4.3 % (ref 3.0–12.0)
NEUTROS PCT: 59.1 % (ref 43.0–77.0)
Neutro Abs: 4.4 10*3/uL (ref 1.4–7.7)
Platelets: 207 10*3/uL (ref 150.0–400.0)
RBC: 4.42 Mil/uL (ref 3.87–5.11)
RDW: 14.7 % (ref 11.5–15.5)
WBC: 7.4 10*3/uL (ref 4.0–10.5)

## 2014-12-17 LAB — TSH: TSH: 1.47 u[IU]/mL (ref 0.35–4.50)

## 2014-12-17 LAB — HEPATIC FUNCTION PANEL
ALBUMIN: 3.8 g/dL (ref 3.5–5.2)
ALT: 12 U/L (ref 0–35)
AST: 15 U/L (ref 0–37)
Alkaline Phosphatase: 74 U/L (ref 39–117)
Bilirubin, Direct: 0.1 mg/dL (ref 0.0–0.3)
Total Bilirubin: 0.5 mg/dL (ref 0.2–1.2)
Total Protein: 7 g/dL (ref 6.0–8.3)

## 2014-12-17 LAB — URINALYSIS, ROUTINE W REFLEX MICROSCOPIC
BILIRUBIN URINE: NEGATIVE
Ketones, ur: NEGATIVE
Leukocytes, UA: NEGATIVE
NITRITE: NEGATIVE
PH: 6 (ref 5.0–8.0)
Specific Gravity, Urine: 1.02 (ref 1.000–1.030)
Total Protein, Urine: NEGATIVE
Urine Glucose: NEGATIVE
Urobilinogen, UA: 0.2 (ref 0.0–1.0)

## 2014-12-17 LAB — LIPID PANEL
CHOLESTEROL: 160 mg/dL (ref 0–200)
HDL: 70.3 mg/dL (ref >39.00)
LDL Cholesterol: 74 mg/dL (ref 0–99)
NonHDL: 90.01
TRIGLYCERIDES: 82 mg/dL (ref 0.0–149.0)
Total CHOL/HDL Ratio: 2
VLDL: 16.4 mg/dL (ref 0.0–40.0)

## 2014-12-17 LAB — VITAMIN D 25 HYDROXY (VIT D DEFICIENCY, FRACTURES): VITD: 29.83 ng/mL — AB (ref 30.00–100.00)

## 2014-12-17 NOTE — Patient Instructions (Signed)
Reflux of gastric acid may be asymptomatic as this may occur mainly during sleep.The triggers for reflux  include stress; the "aspirin family" ; alcohol; peppermint; and caffeine (coffee, tea, cola, and chocolate). The aspirin family would include aspirin and the nonsteroidal agents such as ibuprofen &  Naproxen. Tylenol would not cause reflux. If having symptoms ; food & drink should be avoided for @ least 2 hours before going to bed.   Your next office appointment will be determined based upon review of your pending labs . Those written interpretation of the lab results and instructions will be transmitted to you by My Chart Critical results will be called.  Followup as needed for any active or acute issue. Please report any significant change in your symptoms. 

## 2014-12-17 NOTE — Progress Notes (Signed)
   Subjective:    Patient ID: Sylvia Mcintosh, female    DOB: [birth year], 59 y.o.   MRN: [id number]  HPI  The patient is here for a physical to assess status of active health conditions.  PMH, FH, & Social History reviewed & updated. Her sister now has diabetes; this is the only change in FH as recorded.  She does eat some red meat and fried foods. She does not add salt to her food at the table. She does not exercise in the Summer due to the heat.  She does have occasional dysphasia with liquids or food. This is not a persistent phenomena  She has nocturia at least twice nightly.   Review of Systems  Chest pain, palpitations, tachycardia, exertional dyspnea, paroxysmal nocturnal dyspnea, claudication or edema are absent. No unexplained weight loss, abdominal pain, significant dyspepsia, melena, rectal bleeding, or persistently small caliber stools. Dysuria, pyuria, hematuria, frequency, or polyuria are denied. Change in hair, skin, nails denied. No bowel changes of constipation or diarrhea. No intolerance to cold.     Objective:   Physical Exam  Pertinent or positive findings include: Dense arcus senilis is present bilaterally. Second heart sound is accentuated. Breath sounds are somewhat decreased. There is a slight varus changes to the knees. Deep tendon reflexes are 0-1/2+ at the knees.  General appearance :adequately nourished; in no distress.  Eyes: No conjunctival inflammation or scleral icterus is present.  Oral exam:  Lips and gums are healthy appearing.There is no oropharyngeal erythema or exudate noted. Dental hygiene is good.  Heart:  Normal rate and regular rhythm. S1 normal without gallop, murmur, click, rub or other extra sounds    Lungs:Chest clear to auscultation; no wheezes, rhonchi,rales ,or rubs present.No increased work of breathing.   Abdomen: bowel sounds normal, soft and non-tender without masses, organomegaly or hernias noted.  No guarding or rebound. No  flank tenderness to percussion.  Vascular : all pulses equal ; no bruits present.  Skin:Warm & dry.  Intact without suspicious lesions or rashes ; no tenting or jaundice   Lymphatic: No lymphadenopathy is noted about the head, neck, axilla   Neuro: Strength, tone  normal.         Assessment & Plan:  #1 comprehensive physical exam; no acute findings  #2 occasional dysphasia. Antireflux measures discussed.  #3 nocturia. Urinalysis ordered  Plan: see Orders  & Recommendations

## 2014-12-17 NOTE — Progress Notes (Signed)
Pre visit review using our clinic review tool, if applicable. No additional management support is needed unless otherwise documented below in the visit note. 

## 2014-12-28 ENCOUNTER — Ambulatory Visit: Payer: BC Managed Care – PPO

## 2015-04-08 ENCOUNTER — Ambulatory Visit
Admission: RE | Admit: 2015-04-08 | Discharge: 2015-04-08 | Disposition: A | Discharge status: Home / Self Care | Payer: BLUE CROSS/BLUE SHIELD | Source: Ambulatory Visit

## 2015-04-08 DIAGNOSIS — Z1231 Encounter for screening mammogram for malignant neoplasm of breast: Secondary | ICD-10-CM

## 2015-11-21 ENCOUNTER — Encounter: Payer: BLUE CROSS/BLUE SHIELD | Admitting: Gynecology

## 2015-12-05 ENCOUNTER — Encounter: Payer: Self-pay | Admitting: Gynecology

## 2015-12-05 ENCOUNTER — Ambulatory Visit (INDEPENDENT_AMBULATORY_CARE_PROVIDER_SITE_OTHER): Payer: BLUE CROSS/BLUE SHIELD | Admitting: Gynecology

## 2015-12-05 VITALS — BP 114/66 | Ht 66.0 in | Wt 204.0 lb

## 2015-12-05 DIAGNOSIS — M8580 Other specified disorders of bone density and structure, unspecified site: Secondary | ICD-10-CM

## 2015-12-05 DIAGNOSIS — N952 Postmenopausal atrophic vaginitis: Secondary | ICD-10-CM

## 2015-12-05 DIAGNOSIS — Z01419 Encounter for gynecological examination (general) (routine) without abnormal findings: Secondary | ICD-10-CM

## 2015-12-05 NOTE — Progress Notes (Signed)
    Sylvia Mcintosh 09/27/1955 [id number]        60 y.o.  [id number]  for annual exam.  Doing well without complaints  Past medical history,surgical history, problem list, medications, allergies, family history and social history were all reviewed and documented as reviewed in the EPIC chart.  ROS:  Performed with pertinent positives and negatives included in the history, assessment and plan.   Additional significant findings :  None   Exam: Kennon PortelaKim Gardner assistant Vitals:   12/05/15 1542  BP: 114/66  Weight: 204 lb (92.5 kg)  Height: 5\' 6"  (1.676 m)   Body mass index is 32.93 kg/m.  General appearance:  Normal affect, orientation and appearance. Skin: Grossly normal HEENT: Without gross lesions.  No cervical or supraclavicular adenopathy. Thyroid normal.  Lungs:  Clear without wheezing, rales or rhonchi Cardiac: RR, without RMG Abdominal:  Soft, nontender, without masses, guarding, rebound, organomegaly or hernia Breasts:  Examined lying and sitting without masses, retractions, discharge or axillary adenopathy. Pelvic:  Ext/BUS/Vagina normal with atrophic changes  Cervix normal with atrophic changes  Uterus anteverted, normal size, shape and contour, midline and mobile nontender   Adnexa without masses or tenderness    Anus and perineum normal   Rectovaginal normal sphincter tone without palpated masses or tenderness.    Assessment/Plan:  60 y.o. [id number] female for annual exam.   1. Postmenopausal/atrophic genital changes. No significant hot flushes, night sweats, vaginal dryness or any vaginal bleeding. Continue to monitor and report any issues or bleeding. 2. Osteopenia.  DEXA 10/2014. T score -1.5. FRAX 2.5%/0.1%. No significant change from prior DEXA. 3. Mammography 03/2015. Continue with annual mammography when due. SBE monthly reviewed. 4. Pap smear/HPV -09/2013. No Pap smear done today. No history of significant abnormal Pap smears. Plan repeat Pap smear approaching 5 year  interval per current screening guidelines. 5. Colonoscopy 2011. Repeat recommended interval 10 years per report. Will call and arrange. 6. Health maintenance. No routine lab work done as patient reports this done elsewhere. Follow up 1 year, sooner as needed.   Dara LordsFONTAINE,[name], 4:14 PM 12/05/2015

## 2015-12-05 NOTE — Patient Instructions (Signed)

## 2016-01-11 ENCOUNTER — Encounter: Payer: BLUE CROSS/BLUE SHIELD | Admitting: Gynecology

## 2016-05-02 ENCOUNTER — Ambulatory Visit (INDEPENDENT_AMBULATORY_CARE_PROVIDER_SITE_OTHER): Payer: BLUE CROSS/BLUE SHIELD | Admitting: Internal Medicine

## 2016-05-02 ENCOUNTER — Encounter: Payer: Self-pay | Admitting: Internal Medicine

## 2016-05-02 ENCOUNTER — Other Ambulatory Visit (INDEPENDENT_AMBULATORY_CARE_PROVIDER_SITE_OTHER): Payer: BLUE CROSS/BLUE SHIELD

## 2016-05-02 VITALS — BP 114/74 | HR 65 | Temp 98.3°F | Resp 16 | Ht 66.0 in | Wt 205.0 lb

## 2016-05-02 DIAGNOSIS — M8580 Other specified disorders of bone density and structure, unspecified site: Secondary | ICD-10-CM

## 2016-05-02 DIAGNOSIS — Z0000 Encounter for general adult medical examination without abnormal findings: Secondary | ICD-10-CM

## 2016-05-02 LAB — COMPREHENSIVE METABOLIC PANEL
ALBUMIN: 4.1 g/dL (ref 3.5–5.2)
ALK PHOS: 73 U/L (ref 39–117)
ALT: 12 U/L (ref 0–35)
AST: 16 U/L (ref 0–37)
BILIRUBIN TOTAL: 0.6 mg/dL (ref 0.2–1.2)
BUN: 12 mg/dL (ref 6–23)
CO2: 30 mEq/L (ref 19–32)
Calcium: 9.3 mg/dL (ref 8.4–10.5)
Chloride: 105 mEq/L (ref 96–112)
Creatinine, Ser: 1.02 mg/dL (ref 0.40–1.20)
GFR: 71.52 mL/min (ref >60.00)
Glucose, Bld: 97 mg/dL (ref 70–99)
POTASSIUM: 4.2 meq/L (ref 3.5–5.1)
SODIUM: 140 meq/L (ref 135–145)
TOTAL PROTEIN: 7.7 g/dL (ref 6.0–8.3)

## 2016-05-02 LAB — CBC WITH DIFFERENTIAL/PLATELET
Basophils Absolute: 0 10*3/uL (ref 0.0–0.1)
Basophils Relative: 0.3 % (ref 0.0–3.0)
EOS PCT: 1.3 % (ref 0.0–5.0)
Eosinophils Absolute: 0.1 10*3/uL (ref 0.0–0.7)
HCT: 40.5 % (ref 36.0–46.0)
HEMOGLOBIN: 13.5 g/dL (ref 12.0–15.0)
Lymphocytes Relative: 33.5 % (ref 12.0–46.0)
Lymphs Abs: 2.5 10*3/uL (ref 0.7–4.0)
MCHC: 33.3 g/dL (ref 30.0–36.0)
MCV: 87.6 fl (ref 78.0–100.0)
MONOS PCT: 5.6 % (ref 3.0–12.0)
Monocytes Absolute: 0.4 10*3/uL (ref 0.1–1.0)
Neutro Abs: 4.5 10*3/uL (ref 1.4–7.7)
Neutrophils Relative %: 59.3 % (ref 43.0–77.0)
Platelets: 192 10*3/uL (ref 150.0–400.0)
RBC: 4.62 Mil/uL (ref 3.87–5.11)
RDW: 14.4 % (ref 11.5–15.5)
WBC: 7.6 10*3/uL (ref 4.0–10.5)

## 2016-05-02 LAB — LIPID PANEL
CHOLESTEROL: 165 mg/dL (ref 0–200)
HDL: 76.7 mg/dL (ref >39.00)
LDL Cholesterol: 77 mg/dL (ref 0–99)
NonHDL: 88.31
Total CHOL/HDL Ratio: 2
Triglycerides: 56 mg/dL (ref 0.0–149.0)
VLDL: 11.2 mg/dL (ref 0.0–40.0)

## 2016-05-02 LAB — TSH: TSH: 1.6 u[IU]/mL (ref 0.35–4.50)

## 2016-05-02 LAB — VITAMIN D 25 HYDROXY (VIT D DEFICIENCY, FRACTURES): VITD: 38.33 ng/mL (ref 30.00–100.00)

## 2016-05-02 NOTE — Progress Notes (Signed)
Pre visit review using our clinic review tool, if applicable. No additional management support is needed unless otherwise documented below in the visit note. 

## 2016-05-02 NOTE — Assessment & Plan Note (Signed)
dexa ordered by Dr Audie BoxFontaine - due next year Not compliant with calcium/vitamin d - will try to do better Stressed regular exercise

## 2016-05-02 NOTE — Progress Notes (Signed)
Subjective:    Patient ID: Sylvia Mcintosh, female    DOB: 04-18-1955, 61 y.o.   MRN: [id number]  HPI She is here to establish with a new pcp.  She is here for a physical exam.   She is in school - [employer].  Graduates this coming December.  She is not exercising and has gained weight.  She eats too much junk food, especially while studying.    She has no other concerns.   Medications and allergies reviewed with patient and updated if appropriate.  Patient Active Problem List   Diagnosis Date Noted  . Nonspecific abnormal electrocardiogram (ECG) (EKG) 11/18/2013  . Osteopenia   . Seasonal allergies 04/22/2009    Current Outpatient Prescriptions on File Prior to Visit  Medication Sig Dispense Refill  . Calcium-Phosphorus-Vitamin D (CALCIUM GUMMIES PO) Take by mouth. 2 by mouth daily    . Cholecalciferol (VITAMIN D PO) Take by mouth.     No current facility-administered medications on file prior to visit.     Past Medical History:  Diagnosis Date  . Allergy    Seasonal rhinoconjunctivitis with voice loss  . Depression 2012   work stress related  . Mitral valve prolapse 1984   2 D ECHO  . Nonspecific abnormal electrocardiogram (ECG) (EKG) 2004   Negative nuclear stress test  . Osteopenia 10/2014   T score -1.5 FRAX 2.5%/0.1%. No significant change from prior DEXA.  Marland Kitchen. Vitamin D deficiency     Past Surgical History:  Procedure Laterality Date  . BUNIONECTOMY    . COLONOSCOPY  2012   Negative; [place]  . ECTOPIC PREGNANCY SURGERY    . HAMMER TOE SURGERY     Bilateral  . MOUTH SURGERY     Implants  . TUBAL LIGATION    . Tubal Reversal      Social History   Social History  . Marital status: Married    Spouse name: N/A  . Number of children: N/A  . Years of education: N/A   Social History Main Topics  . Smoking status: Never Smoker  . Smokeless tobacco: Never Used  . Alcohol use No  . Drug use: No  . Sexual activity: Yes    Birth control/  protection: Post-menopausal     Comment: 1st intercourse 61 yo-1 partner   Other Topics Concern  . None   Social History Narrative  . None    Family History  Problem Relation Age of Onset  . Breast cancer Mother 4041  . Hypertension Sister   . Diabetes Sister     diet & exercise controlled  . Hypertension Brother   . Heart attack Maternal Grandmother 70    Also stroke, hypertension  . Stroke Maternal Grandmother     in 8470s  . Breast cancer Maternal Aunt 75  . Hypertension Daughter   . Diabetes Father   . Heart murmur Father     two valves replaced  . Prostate cancer Father     Review of Systems  Constitutional: Negative for chills and fever.  Eyes: Negative for visual disturbance.  Respiratory: Negative for cough, shortness of breath and wheezing.   Cardiovascular: Negative for chest pain, palpitations and leg swelling.  Gastrointestinal: Negative for abdominal pain, blood in stool, constipation, diarrhea and nausea.       No gerd  Genitourinary: Negative for dysuria and hematuria.  Musculoskeletal: Positive for arthralgias (mild) and back pain (mild, sometimes).  Skin: Negative for color change and rash.  Neurological: Negative for dizziness, light-headedness and headaches.  Psychiatric/Behavioral: Negative for dysphoric mood. The patient is not nervous/anxious.        Objective:   Vitals:   05/02/16 0802  BP: 114/74  Pulse: 65  Resp: 16  Temp: 98.3 F (36.8 C)   Filed Weights   05/02/16 0802  Weight: 205 lb (93 kg)   Body mass index is 33.09 kg/m.  Wt Readings from Last 3 Encounters:  05/02/16 205 lb (93 kg)  12/05/15 204 lb (92.5 kg)  12/17/14 206 lb (93.4 kg)     Physical Exam Constitutional: She appears well-developed and well-nourished. No distress.  HENT:  Head: Normocephalic and atraumatic.  Right Ear: External ear normal. Normal ear canal and TM Left Ear: External ear normal.  Normal ear canal and TM Mouth/Throat: Oropharynx is clear and  moist.  Eyes: Conjunctivae and EOM are normal.  Neck: Neck supple. No tracheal deviation present. No thyromegaly present.  No carotid bruit  Cardiovascular: Normal rate, regular rhythm and normal heart sounds.   No murmur heard.  No edema. Pulmonary/Chest: Effort normal and breath sounds normal. No respiratory distress. She has no wheezes. She has no rales.  Breast: deferred to Gyn Abdominal: Soft. She exhibits no distension. There is no tenderness.  Lymphadenopathy: She has no cervical adenopathy.  Skin: Skin is warm and dry. She is not diaphoretic.  Psychiatric: She has a normal mood and affect. Her behavior is normal.       Assessment & Plan:   Physical exam: Screening blood work  ordered Immunizations  Up to date  Colonoscopy  Up to date  Mammogram  Up to date  Gyn  Up to date  Dexa - has osteopenia - last done 2016 - due next year Eye exams   Up to date  EKG - done 2015 Exercise - none -stressed regular exercise Weight - overweight  - discussed weight loss Skin  - no concerns Substance abuse - none  See Problem List for Assessment and Plan of chronic medical problems.   FU annually

## 2016-05-02 NOTE — Patient Instructions (Addendum)
Test(s) ordered today. Your results will be released to MyChart (or called to you) after review, usually within 72hours after test completion. If any changes need to be made, you will be notified at that same time.  All other Health Maintenance issues reviewed.   All recommended immunizations and age-appropriate screenings are up-to-date or discussed.  No immunizations administered today.   Medications reviewed and updated.  No changes recommended at this time.   Please followup in one year   Health Maintenance, Female Introduction Adopting a healthy lifestyle and getting preventive care can go a long way to promote health and wellness. Talk with your health care provider about what schedule of regular examinations is right for you. This is a good chance for you to check in with your provider about disease prevention and staying healthy. In between checkups, there are plenty of things you can do on your own. Experts have done a lot of research about which lifestyle changes and preventive measures are most likely to keep you healthy. Ask your health care provider for more information. Weight and diet Eat a healthy diet  Be sure to include plenty of vegetables, fruits, low-fat dairy products, and lean protein.  Do not eat a lot of foods high in solid fats, added sugars, or salt.  Get regular exercise. This is one of the most important things you can do for your health.  Most adults should exercise for at least 150 minutes each week. The exercise should increase your heart rate and make you sweat (moderate-intensity exercise).  Most adults should also do strengthening exercises at least twice a week. This is in addition to the moderate-intensity exercise. Maintain a healthy weight  Body mass index (BMI) is a measurement that can be used to identify possible weight problems. It estimates body fat based on height and weight. Your health care provider can help determine your BMI and help  you achieve or maintain a healthy weight.  For females 20 years of age and older:  A BMI below 18.5 is considered underweight.  A BMI of 18.5 to 24.9 is normal.  A BMI of 25 to 29.9 is considered overweight.  A BMI of 30 and above is considered obese. Watch levels of cholesterol and blood lipids  You should start having your blood tested for lipids and cholesterol at 59 years of age, then have this test every 5 years.  You may need to have your cholesterol levels checked more often if:  Your lipid or cholesterol levels are high.  You are older than 59 years of age.  You are at high risk for heart disease. Cancer screening Lung Cancer  Lung cancer screening is recommended for adults 55-80 years old who are at high risk for lung cancer because of a history of smoking.  A yearly low-dose CT scan of the lungs is recommended for people who:  Currently smoke.  Have quit within the past 15 years.  Have at least a 30-pack-year history of smoking. A pack year is smoking an average of one pack of cigarettes a day for 1 year.  Yearly screening should continue until it has been 15 years since you quit.  Yearly screening should stop if you develop a health problem that would prevent you from having lung cancer treatment. Breast Cancer  Practice breast self-awareness. This means understanding how your breasts normally appear and feel.  It also means doing regular breast self-exams. Let your health care provider know about any changes, no matter how   small.  If you are in your 20s or 30s, you should have a clinical breast exam (CBE) by a health care provider every 1-3 years as part of a regular health exam.  If you are 40 or older, have a CBE every year. Also consider having a breast X-ray (mammogram) every year.  If you have a family history of breast cancer, talk to your health care provider about genetic screening.  If you are at high risk for breast cancer, talk to your health  care provider about having an MRI and a mammogram every year.  Breast cancer gene (BRCA) assessment is recommended for women who have family members with BRCA-related cancers. BRCA-related cancers include:  Breast.  Ovarian.  Tubal.  Peritoneal cancers.  Results of the assessment will determine the need for genetic counseling and BRCA1 and BRCA2 testing. Cervical Cancer  Your health care provider may recommend that you be screened regularly for cancer of the pelvic organs (ovaries, uterus, and vagina). This screening involves a pelvic examination, including checking for microscopic changes to the surface of your cervix (Pap test). You may be encouraged to have this screening done every 3 years, beginning at age 21.  For women ages 30-65, health care providers may recommend pelvic exams and Pap testing every 3 years, or they may recommend the Pap and pelvic exam, combined with testing for human papilloma virus (HPV), every 5 years. Some types of HPV increase your risk of cervical cancer. Testing for HPV may also be done on women of any age with unclear Pap test results.  Other health care providers may not recommend any screening for nonpregnant women who are considered low risk for pelvic cancer and who do not have symptoms. Ask your health care provider if a screening pelvic exam is right for you.  If you have had past treatment for cervical cancer or a condition that could lead to cancer, you need Pap tests and screening for cancer for at least 20 years after your treatment. If Pap tests have been discontinued, your risk factors (such as having a new sexual partner) need to be reassessed to determine if screening should resume. Some women have medical problems that increase the chance of getting cervical cancer. In these cases, your health care provider may recommend more frequent screening and Pap tests. Colorectal Cancer  This type of cancer can be detected and often prevented.  Routine  colorectal cancer screening usually begins at 59 years of age and continues through 59 years of age.  Your health care provider may recommend screening at an earlier age if you have risk factors for colon cancer.  Your health care provider may also recommend using home test kits to check for hidden blood in the stool.  A small camera at the end of a tube can be used to examine your colon directly (sigmoidoscopy or colonoscopy). This is done to check for the earliest forms of colorectal cancer.  Routine screening usually begins at age 50.  Direct examination of the colon should be repeated every 5-10 years through 59 years of age. However, you may need to be screened more often if early forms of precancerous polyps or small growths are found. Skin Cancer  Check your skin from head to toe regularly.  Tell your health care provider about any new moles or changes in moles, especially if there is a change in a mole's shape or color.  Also tell your health care provider if you have a mole that is   larger than the size of a pencil eraser.  Always use sunscreen. Apply sunscreen liberally and repeatedly throughout the day.  Protect yourself by wearing long sleeves, pants, a wide-brimmed hat, and sunglasses whenever you are outside. Heart disease, diabetes, and high blood pressure  High blood pressure causes heart disease and increases the risk of stroke. High blood pressure is more likely to develop in:  People who have blood pressure in the high end of the normal range (130-139/85-89 mm Hg).  People who are overweight or obese.  People who are African American.  If you are 18-39 years of age, have your blood pressure checked every 3-5 years. If you are 40 years of age or older, have your blood pressure checked every year. You should have your blood pressure measured twice-once when you are at a hospital or clinic, and once when you are not at a hospital or clinic. Record the average of the two  measurements. To check your blood pressure when you are not at a hospital or clinic, you can use:  An automated blood pressure machine at a pharmacy.  A home blood pressure monitor.  If you are between 55 years and 79 years old, ask your health care provider if you should take aspirin to prevent strokes.  Have regular diabetes screenings. This involves taking a blood sample to check your fasting blood sugar level.  If you are at a normal weight and have a low risk for diabetes, have this test once every three years after 59 years of age.  If you are overweight and have a high risk for diabetes, consider being tested at a younger age or more often. Preventing infection Hepatitis B  If you have a higher risk for hepatitis B, you should be screened for this virus. You are considered at high risk for hepatitis B if:  You were born in a country where hepatitis B is common. Ask your health care provider which countries are considered high risk.  Your parents were born in a high-risk country, and you have not been immunized against hepatitis B (hepatitis B vaccine).  You have HIV or AIDS.  You use needles to inject street drugs.  You live with someone who has hepatitis B.  You have had sex with someone who has hepatitis B.  You get hemodialysis treatment.  You take certain medicines for conditions, including cancer, organ transplantation, and autoimmune conditions. Hepatitis C  Blood testing is recommended for:  Everyone born from 1945 through 1965.  Anyone with known risk factors for hepatitis C. Sexually transmitted infections (STIs)  You should be screened for sexually transmitted infections (STIs) including gonorrhea and chlamydia if:  You are sexually active and are younger than 59 years of age.  You are older than 59 years of age and your health care provider tells you that you are at risk for this type of infection.  Your sexual activity has changed since you were last  screened and you are at an increased risk for chlamydia or gonorrhea. Ask your health care provider if you are at risk.  If you do not have HIV, but are at risk, it may be recommended that you take a prescription medicine daily to prevent HIV infection. This is called pre-exposure prophylaxis (PrEP). You are considered at risk if:  You are sexually active and do not regularly use condoms or know the HIV status of your partner(s).  You take drugs by injection.  You are sexually active with a partner   who has HIV. Talk with your health care provider about whether you are at high risk of being infected with HIV. If you choose to begin PrEP, you should first be tested for HIV. You should then be tested every 3 months for as long as you are taking PrEP. Pregnancy  If you are premenopausal and you may become pregnant, ask your health care provider about preconception counseling.  If you may become pregnant, take 400 to 800 micrograms (mcg) of folic acid every day.  If you want to prevent pregnancy, talk to your health care provider about birth control (contraception). Osteoporosis and menopause  Osteoporosis is a disease in which the bones lose minerals and strength with aging. This can result in serious bone fractures. Your risk for osteoporosis can be identified using a bone density scan.  If you are 65 years of age or older, or if you are at risk for osteoporosis and fractures, ask your health care provider if you should be screened.  Ask your health care provider whether you should take a calcium or vitamin D supplement to lower your risk for osteoporosis.  Menopause may have certain physical symptoms and risks.  Hormone replacement therapy may reduce some of these symptoms and risks. Talk to your health care provider about whether hormone replacement therapy is right for you. Follow these instructions at home:  Schedule regular health, dental, and eye exams.  Stay current with your  immunizations.  Do not use any tobacco products including cigarettes, chewing tobacco, or electronic cigarettes.  If you are pregnant, do not drink alcohol.  If you are breastfeeding, limit how much and how often you drink alcohol.  Limit alcohol intake to no more than 1 drink per day for nonpregnant women. One drink equals 12 ounces of beer, 5 ounces of wine, or 1 ounces of hard liquor.  Do not use street drugs.  Do not share needles.  Ask your health care provider for help if you need support or information about quitting drugs.  Tell your health care provider if you often feel depressed.  Tell your health care provider if you have ever been abused or do not feel safe at home. This information is not intended to replace advice given to you by your health care provider. Make sure you discuss any questions you have with your health care provider. Document Released: 10/09/2010 Document Revised: 09/01/2015 Document Reviewed: 12/28/2014  2017 Elsevier    

## 2016-05-05 ENCOUNTER — Encounter: Payer: Self-pay | Admitting: Internal Medicine

## 2016-05-08 ENCOUNTER — Other Ambulatory Visit: Payer: Self-pay | Admitting: Gynecology

## 2016-05-08 DIAGNOSIS — Z1231 Encounter for screening mammogram for malignant neoplasm of breast: Secondary | ICD-10-CM

## 2016-05-31 ENCOUNTER — Ambulatory Visit
Admission: RE | Admit: 2016-05-31 | Discharge: 2016-05-31 | Disposition: A | Discharge status: Home / Self Care | Payer: BLUE CROSS/BLUE SHIELD | Source: Ambulatory Visit | Attending: Gynecology | Admitting: Gynecology

## 2016-05-31 DIAGNOSIS — Z1231 Encounter for screening mammogram for malignant neoplasm of breast: Secondary | ICD-10-CM

## 2016-12-05 ENCOUNTER — Ambulatory Visit (INDEPENDENT_AMBULATORY_CARE_PROVIDER_SITE_OTHER): Payer: BLUE CROSS/BLUE SHIELD | Admitting: Gynecology

## 2016-12-05 ENCOUNTER — Encounter: Payer: Self-pay | Admitting: Gynecology

## 2016-12-05 VITALS — BP 122/80 | Ht 66.0 in | Wt 205.0 lb

## 2016-12-05 DIAGNOSIS — M8580 Other specified disorders of bone density and structure, unspecified site: Secondary | ICD-10-CM | POA: Diagnosis not present

## 2016-12-05 DIAGNOSIS — N952 Postmenopausal atrophic vaginitis: Secondary | ICD-10-CM | POA: Diagnosis not present

## 2016-12-05 DIAGNOSIS — Z01411 Encounter for gynecological examination (general) (routine) with abnormal findings: Secondary | ICD-10-CM

## 2016-12-05 NOTE — Progress Notes (Signed)
    Sylvia Mcintosh 09-Jul-1955 [id number]        61 y.o.  [id number] for annual exam.    Past medical history,surgical history, problem list, medications, allergies, family history and social history were all reviewed and documented as reviewed in the EPIC chart.  ROS:  Performed with pertinent positives and negatives included in the history, assessment and plan.   Additional significant findings :  None   Exam: Kennon PortelaKim Gardner assistant Vitals:   12/05/16 1608  BP: 122/80  Weight: 205 lb (93 kg)  Height: 5\' 6"  (1.676 m)   Body mass index is 33.09 kg/m.  General appearance:  Normal affect, orientation and appearance. Skin: Grossly normal HEENT: Without gross lesions.  No cervical or supraclavicular adenopathy. Thyroid normal.  Lungs:  Clear without wheezing, rales or rhonchi Cardiac: RR, without RMG Abdominal:  Soft, nontender, without masses, guarding, rebound, organomegaly or hernia Breasts:  Examined lying and sitting without masses, retractions, discharge or axillary adenopathy. Pelvic:  Ext, BUS, Vagina: Normal with atrophic changes  Cervix: With atrophic changes  Uterus: Anteverted, normal size, shape and contour, midline and mobile nontender   Adnexa: Without masses or tenderness    Anus and perineum: Normal   Rectovaginal: Normal sphincter tone without palpated masses or tenderness.    Assessment/Plan:  61 y.o. [id number] female for annual exam.   1. Postmenopausal/atrophic genital changes. No significant hot flushes, night sweats, vaginal dryness or any bleeding. Continue to monitor report any issues or bleeding. 2. Osteopenia. DEXA 2016 T score -1.5 FRAX 2.5%/0.1%. No significant change from prior DEXA. Options to wait longer repeat DEXA now a 2 year interval reviewed. Patient prefers to repeat now and she will schedule this and follow up for this. 3. Pap smear/HPV 09/2013 negative. No Pap smear done today. No history of significant abnormal Pap smears. Plan repeat Pap smear  at 5 year interval per current screening guidelines. 4. Colonoscopy 2011. Reported repeat interval 10 years. 5. Mammography 05/2016. Continue with annual mammography when due. SBE monthly reviewed. 6. Health maintenance. No routine lab work done as patient does this elsewhere. Follow up 1 year, sooner as needed.   Dara LordsFONTAINE,Cipriano Millikan P MD, 4:30 PM 12/05/2016

## 2016-12-05 NOTE — Patient Instructions (Signed)
Followup for bone density as scheduled. 

## 2017-01-31 ENCOUNTER — Telehealth: Payer: Self-pay | Admitting: Internal Medicine

## 2017-01-31 NOTE — Telephone Encounter (Signed)
Patient states fax number is [phone]  ATTN:  Perrin MalteseAbigal

## 2017-01-31 NOTE — Telephone Encounter (Signed)
Letter written

## 2017-01-31 NOTE — Telephone Encounter (Signed)
Letter has been faxed.

## 2017-01-31 NOTE — Telephone Encounter (Signed)
Tried contacting pt, no answer. LVM with number listed below to get fax number to send letter over.

## 2017-01-31 NOTE — Telephone Encounter (Signed)
Patient calling in stating there is a weight loss study program through [employer] [facility] in [place].  Study can be reached at [phone] ext [phone].  States she is requesting a letter stating that she will be able to participate in the program based off of her last OV in January.  States she needs statement that all her EKG's have came back normal.  Patient states she would have to have letter turned in by tomorrow therefore not able to make appt.

## 2017-05-14 ENCOUNTER — Other Ambulatory Visit: Payer: Self-pay | Admitting: Gynecology

## 2017-05-14 DIAGNOSIS — Z1231 Encounter for screening mammogram for malignant neoplasm of breast: Secondary | ICD-10-CM

## 2017-06-05 ENCOUNTER — Ambulatory Visit
Admission: RE | Admit: 2017-06-05 | Discharge: 2017-06-05 | Disposition: A | Discharge status: Home / Self Care | Payer: BLUE CROSS/BLUE SHIELD | Source: Ambulatory Visit | Attending: Gynecology | Admitting: Gynecology

## 2017-06-05 DIAGNOSIS — Z1231 Encounter for screening mammogram for malignant neoplasm of breast: Secondary | ICD-10-CM

## 2017-06-23 NOTE — Progress Notes (Signed)
Subjective:    Patient ID: Sylvia Mcintosh, female    DOB: [birth year], 62 y.o.   MRN: [id number]  HPI She is here for a physical exam.   She is going to [place] in August and she did go to ID and she got everything other than yellow fever.  She needs to find the vaccine.   She has no concerns.   Medications and allergies reviewed with patient and updated if appropriate.  Patient Active Problem List   Diagnosis Date Noted  . Nonspecific abnormal electrocardiogram (ECG) (EKG) 11/18/2013  . Osteopenia   . Seasonal allergies 04/22/2009    Current Outpatient Medications on File Prior to Visit  Medication Sig Dispense Refill  . Multiple Vitamins-Minerals (WOMENS MULTIVITAMIN PO) Take by mouth daily.     No current facility-administered medications on file prior to visit.     Past Medical History:  Diagnosis Date  . Allergy    Seasonal rhinoconjunctivitis with voice loss  . Depression 2012   work stress related  . Mitral valve prolapse 1984   2 D ECHO  . Nonspecific abnormal electrocardiogram (ECG) (EKG) 2004   Negative nuclear stress test  . Osteopenia 10/2014   T score -1.5 FRAX 2.5%/0.1%. No significant change from prior DEXA.  Marland Kitchen. Vitamin D deficiency     Past Surgical History:  Procedure Laterality Date  . BUNIONECTOMY    . COLONOSCOPY  2012   Negative; [place]  . ECTOPIC PREGNANCY SURGERY    . HAMMER TOE SURGERY     Bilateral  . MOUTH SURGERY     Implants  . TUBAL LIGATION    . Tubal Reversal      Social History   Socioeconomic History  . Marital status: Married    Spouse name: None  . Number of children: None  . Years of education: None  . Highest education level: None  Social Needs  . Financial resource strain: None  . Food insecurity - worry: None  . Food insecurity - inability: None  . Transportation needs - medical: None  . Transportation needs - non-medical: None  Occupational History  . None  Tobacco Use  . Smoking status: Never  Smoker  . Smokeless tobacco: Never Used  Substance and Sexual Activity  . Alcohol use: No    Alcohol/week: 0.0 oz  . Drug use: No  . Sexual activity: Yes    Birth control/protection: Post-menopausal    Comment: 1st intercourse 62 yo-1 partner-1 partner  Other Topics Concern  . None  Social History Narrative  . None    Family History  Problem Relation Age of Onset  . Breast cancer Mother [id number]  . Hypertension Sister   . Diabetes Sister        diet & exercise controlled  . Hypertension Brother   . Heart attack Maternal Grandmother 70       Also stroke, hypertension  . Stroke Maternal Grandmother        in 7070s  . Breast cancer Maternal Aunt 75  . Hypertension Daughter   . Diabetes Father   . Heart murmur Father        two valves replaced  . Prostate cancer Father     Review of Systems  Constitutional: Negative for chills and fever.  Eyes: Negative for visual disturbance.  Respiratory: Negative for cough, shortness of breath and wheezing.   Cardiovascular: Negative for chest pain, palpitations and leg swelling.  Gastrointestinal: Negative for abdominal pain, blood in stool, constipation, diarrhea  and nausea.  Genitourinary: Negative for dysuria and hematuria.  Musculoskeletal: Positive for arthralgias (minimal knee). Negative for back pain and myalgias.  Skin: Negative for color change and rash.  Neurological: Negative for light-headedness and headaches.  Psychiatric/Behavioral: Negative for dysphoric mood. The patient is not nervous/anxious.        Objective:   Vitals:   06/25/17 0946  BP: 128/82  Pulse: 70  Resp: 16  Temp: 97.8 F (36.6 C)  SpO2: 96%   Filed Weights   06/25/17 0946  Weight: 200 lb (90.7 kg)   Body mass index is 32.28 kg/m.  Wt Readings from Last 3 Encounters:  06/25/17 200 lb (90.7 kg)  12/05/16 205 lb (93 kg)  05/02/16 205 lb (93 kg)     Physical Exam Constitutional: She appears well-developed and well-nourished. No distress.  HENT:  Head:  Normocephalic and atraumatic.  Right Ear: External ear normal. Normal ear canal and TM Left Ear: External ear normal.  Normal ear canal and TM Mouth/Throat: Oropharynx is clear and moist.  Eyes: Conjunctivae and EOM are normal.  Neck: Neck supple. No tracheal deviation present. No thyromegaly present.  No carotid bruit  Cardiovascular: Normal rate, regular rhythm and normal heart sounds.   No murmur heard.  No edema. Pulmonary/Chest: Effort normal and breath sounds normal. No respiratory distress. She has no wheezes. She has no rales.  Breast: deferred to Gyn Abdominal: Soft. She exhibits no distension. There is no tenderness.  Lymphadenopathy: She has no cervical adenopathy.  Skin: Skin is warm and dry. She is not diaphoretic.  Psychiatric: She has a normal mood and affect. Her behavior is normal.        Assessment & Plan:   Physical exam: Screening blood work  ordered Immunizations  Discussed shingrix, flu vaccine up to date, others good Colonoscopy   Up to date  Mammogram   Up to date  Gyn   Up to date  Dexa   Up to date  Eye exams   Up to date  EKG    Last done 2015 Exercise   Walking 2 miles a day, some weight sometimes Weight  Advised weight loss Skin  No concerns Substance abuse   none  See Problem List for Assessment and Plan of chronic medical problems.   FU annually

## 2017-06-23 NOTE — Patient Instructions (Addendum)
Test(s) ordered today. Your results will be released to MyChart (or called to you) after review, usually within 72hours after test completion. If any changes need to be made, you will be notified at that same time.  All other Health Maintenance issues reviewed.   All recommended immunizations and age-appropriate screenings are up-to-date or discussed.  No immunizations administered today.   Medications reviewed and updated.  No changes recommended at this time.    Please followup in one year   Health Maintenance, Female Adopting a healthy lifestyle and getting preventive care can go a long way to promote health and wellness. Talk with your health care provider about what schedule of regular examinations is right for you. This is a good chance for you to check in with your provider about disease prevention and staying healthy. In between checkups, there are plenty of things you can do on your own. Experts have done a lot of research about which lifestyle changes and preventive measures are most likely to keep you healthy. Ask your health care provider for more information. Weight and diet Eat a healthy diet  Be sure to include plenty of vegetables, fruits, low-fat dairy products, and lean protein.  Do not eat a lot of foods high in solid fats, added sugars, or salt.  Get regular exercise. This is one of the most important things you can do for your health. ? Most adults should exercise for at least 150 minutes each week. The exercise should increase your heart rate and make you sweat (moderate-intensity exercise). ? Most adults should also do strengthening exercises at least twice a week. This is in addition to the moderate-intensity exercise.  Maintain a healthy weight  Body mass index (BMI) is a measurement that can be used to identify possible weight problems. It estimates body fat based on height and weight. Your health care provider can help determine your BMI and help you achieve  or maintain a healthy weight.  For females 20 years of age and older: ? A BMI below 18.5 is considered underweight. ? A BMI of 18.5 to 24.9 is normal. ? A BMI of 25 to 29.9 is considered overweight. ? A BMI of 30 and above is considered obese.  Watch levels of cholesterol and blood lipids  You should start having your blood tested for lipids and cholesterol at 60 years of age, then have this test every 5 years.  You may need to have your cholesterol levels checked more often if: ? Your lipid or cholesterol levels are high. ? You are older than 60 years of age. ? You are at high risk for heart disease.  Cancer screening Lung Cancer  Lung cancer screening is recommended for adults 55-80 years old who are at high risk for lung cancer because of a history of smoking.  A yearly low-dose CT scan of the lungs is recommended for people who: ? Currently smoke. ? Have quit within the past 15 years. ? Have at least a 30-pack-year history of smoking. A pack year is smoking an average of one pack of cigarettes a day for 1 year.  Yearly screening should continue until it has been 15 years since you quit.  Yearly screening should stop if you develop a health problem that would prevent you from having lung cancer treatment.  Breast Cancer  Practice breast self-awareness. This means understanding how your breasts normally appear and feel.  It also means doing regular breast self-exams. Let your health care provider know about any   changes, no matter how small.  If you are in your 20s or 30s, you should have a clinical breast exam (CBE) by a health care provider every 1-3 years as part of a regular health exam.  If you are 40 or older, have a CBE every year. Also consider having a breast X-ray (mammogram) every year.  If you have a family history of breast cancer, talk to your health care provider about genetic screening.  If you are at high risk for breast cancer, talk to your health care  provider about having an MRI and a mammogram every year.  Breast cancer gene (BRCA) assessment is recommended for women who have family members with BRCA-related cancers. BRCA-related cancers include: ? Breast. ? Ovarian. ? Tubal. ? Peritoneal cancers.  Results of the assessment will determine the need for genetic counseling and BRCA1 and BRCA2 testing.  Cervical Cancer Your health care provider may recommend that you be screened regularly for cancer of the pelvic organs (ovaries, uterus, and vagina). This screening involves a pelvic examination, including checking for microscopic changes to the surface of your cervix (Pap test). You may be encouraged to have this screening done every 3 years, beginning at age 21.  For women ages 30-65, health care providers may recommend pelvic exams and Pap testing every 3 years, or they may recommend the Pap and pelvic exam, combined with testing for human papilloma virus (HPV), every 5 years. Some types of HPV increase your risk of cervical cancer. Testing for HPV may also be done on women of any age with unclear Pap test results.  Other health care providers may not recommend any screening for nonpregnant women who are considered low risk for pelvic cancer and who do not have symptoms. Ask your health care provider if a screening pelvic exam is right for you.  If you have had past treatment for cervical cancer or a condition that could lead to cancer, you need Pap tests and screening for cancer for at least 20 years after your treatment. If Pap tests have been discontinued, your risk factors (such as having a new sexual partner) need to be reassessed to determine if screening should resume. Some women have medical problems that increase the chance of getting cervical cancer. In these cases, your health care provider may recommend more frequent screening and Pap tests.  Colorectal Cancer  This type of cancer can be detected and often prevented.  Routine  colorectal cancer screening usually begins at 60 years of age and continues through 60 years of age.  Your health care provider may recommend screening at an earlier age if you have risk factors for colon cancer.  Your health care provider may also recommend using home test kits to check for hidden blood in the stool.  A small camera at the end of a tube can be used to examine your colon directly (sigmoidoscopy or colonoscopy). This is done to check for the earliest forms of colorectal cancer.  Routine screening usually begins at age 50.  Direct examination of the colon should be repeated every 5-10 years through 60 years of age. However, you may need to be screened more often if early forms of precancerous polyps or small growths are found.  Skin Cancer  Check your skin from head to toe regularly.  Tell your health care provider about any new moles or changes in moles, especially if there is a change in a mole's shape or color.  Also tell your health care provider if   you have a mole that is larger than the size of a pencil eraser.  Always use sunscreen. Apply sunscreen liberally and repeatedly throughout the day.  Protect yourself by wearing long sleeves, pants, a wide-brimmed hat, and sunglasses whenever you are outside.  Heart disease, diabetes, and high blood pressure  High blood pressure causes heart disease and increases the risk of stroke. High blood pressure is more likely to develop in: ? People who have blood pressure in the high end of the normal range (130-139/85-89 mm Hg). ? People who are overweight or obese. ? People who are African American.  If you are 18-39 years of age, have your blood pressure checked every 3-5 years. If you are 40 years of age or older, have your blood pressure checked every year. You should have your blood pressure measured twice-once when you are at a hospital or clinic, and once when you are not at a hospital or clinic. Record the average of the  two measurements. To check your blood pressure when you are not at a hospital or clinic, you can use: ? An automated blood pressure machine at a pharmacy. ? A home blood pressure monitor.  If you are between 55 years and 79 years old, ask your health care provider if you should take aspirin to prevent strokes.  Have regular diabetes screenings. This involves taking a blood sample to check your fasting blood sugar level. ? If you are at a normal weight and have a low risk for diabetes, have this test once every three years after 60 years of age. ? If you are overweight and have a high risk for diabetes, consider being tested at a younger age or more often. Preventing infection Hepatitis B  If you have a higher risk for hepatitis B, you should be screened for this virus. You are considered at high risk for hepatitis B if: ? You were born in a country where hepatitis B is common. Ask your health care provider which countries are considered high risk. ? Your parents were born in a high-risk country, and you have not been immunized against hepatitis B (hepatitis B vaccine). ? You have HIV or AIDS. ? You use needles to inject street drugs. ? You live with someone who has hepatitis B. ? You have had sex with someone who has hepatitis B. ? You get hemodialysis treatment. ? You take certain medicines for conditions, including cancer, organ transplantation, and autoimmune conditions.  Hepatitis C  Blood testing is recommended for: ? Everyone born from 1945 through 1965. ? Anyone with known risk factors for hepatitis C.  Sexually transmitted infections (STIs)  You should be screened for sexually transmitted infections (STIs) including gonorrhea and chlamydia if: ? You are sexually active and are younger than 60 years of age. ? You are older than 60 years of age and your health care provider tells you that you are at risk for this type of infection. ? Your sexual activity has changed since you  were last screened and you are at an increased risk for chlamydia or gonorrhea. Ask your health care provider if you are at risk.  If you do not have HIV, but are at risk, it may be recommended that you take a prescription medicine daily to prevent HIV infection. This is called pre-exposure prophylaxis (PrEP). You are considered at risk if: ? You are sexually active and do not regularly use condoms or know the HIV status of your partner(s). ? You take drugs by   You are sexually active with a partner who has HIV.  Talk with your health care provider about whether you are at high risk of being infected with HIV. If you choose to begin PrEP, you should first be tested for HIV. You should then be tested every 3 months for as long as you are taking PrEP. Pregnancy  If you are premenopausal and you may become pregnant, ask your health care provider about preconception counseling.  If you may become pregnant, take 400 to 800 micrograms (mcg) of folic acid every day.  If you want to prevent pregnancy, talk to your health care provider about birth control (contraception). Osteoporosis and menopause  Osteoporosis is a disease in which the bones lose minerals and strength with aging. This can result in serious bone fractures. Your risk for osteoporosis can be identified using a bone density scan.  If you are 65 years of age or older, or if you are at risk for osteoporosis and fractures, ask your health care provider if you should be screened.  Ask your health care provider whether you should take a calcium or vitamin D supplement to lower your risk for osteoporosis.  Menopause may have certain physical symptoms and risks.  Hormone replacement therapy may reduce some of these symptoms and risks. Talk to your health care provider about whether hormone replacement therapy is right for you. Follow these instructions at home:  Schedule regular health, dental, and eye exams.  Stay current  with your immunizations.  Do not use any tobacco products including cigarettes, chewing tobacco, or electronic cigarettes.  If you are pregnant, do not drink alcohol.  If you are breastfeeding, limit how much and how often you drink alcohol.  Limit alcohol intake to no more than 1 drink per day for nonpregnant women. One drink equals 12 ounces of beer, 5 ounces of wine, or 1 ounces of hard liquor.  Do not use street drugs.  Do not share needles.  Ask your health care provider for help if you need support or information about quitting drugs.  Tell your health care provider if you often feel depressed.  Tell your health care provider if you have ever been abused or do not feel safe at home. This information is not intended to replace advice given to you by your health care provider. Make sure you discuss any questions you have with your health care provider. Document Released: 10/09/2010 Document Revised: 09/01/2015 Document Reviewed: 12/28/2014 Elsevier Interactive Patient Education  2018 [employer]  

## 2017-06-25 ENCOUNTER — Encounter: Payer: Self-pay | Admitting: Internal Medicine

## 2017-06-25 ENCOUNTER — Other Ambulatory Visit (INDEPENDENT_AMBULATORY_CARE_PROVIDER_SITE_OTHER): Payer: BLUE CROSS/BLUE SHIELD

## 2017-06-25 ENCOUNTER — Ambulatory Visit (INDEPENDENT_AMBULATORY_CARE_PROVIDER_SITE_OTHER): Payer: BLUE CROSS/BLUE SHIELD | Admitting: Internal Medicine

## 2017-06-25 VITALS — BP 128/82 | HR 70 | Temp 97.8°F | Resp 16 | Ht 66.0 in | Wt 200.0 lb

## 2017-06-25 DIAGNOSIS — Z0000 Encounter for general adult medical examination without abnormal findings: Secondary | ICD-10-CM | POA: Diagnosis not present

## 2017-06-25 DIAGNOSIS — Z833 Family history of diabetes mellitus: Secondary | ICD-10-CM | POA: Diagnosis not present

## 2017-06-25 DIAGNOSIS — M8580 Other specified disorders of bone density and structure, unspecified site: Secondary | ICD-10-CM | POA: Diagnosis not present

## 2017-06-25 LAB — CBC WITH DIFFERENTIAL/PLATELET
BASOS PCT: 0.2 % (ref 0.0–3.0)
Basophils Absolute: 0 10*3/uL (ref 0.0–0.1)
EOS PCT: 0.4 % (ref 0.0–5.0)
Eosinophils Absolute: 0 10*3/uL (ref 0.0–0.7)
HCT: 39.9 % (ref 36.0–46.0)
Hemoglobin: 13.2 g/dL (ref 12.0–15.0)
LYMPHS ABS: 2.2 10*3/uL (ref 0.7–4.0)
Lymphocytes Relative: 31.7 % (ref 12.0–46.0)
MCHC: 33 g/dL (ref 30.0–36.0)
MCV: 88.5 fl (ref 78.0–100.0)
MONO ABS: 0.3 10*3/uL (ref 0.1–1.0)
Monocytes Relative: 4.3 % (ref 3.0–12.0)
NEUTROS ABS: 4.4 10*3/uL (ref 1.4–7.7)
NEUTROS PCT: 63.4 % (ref 43.0–77.0)
PLATELETS: 202 10*3/uL (ref 150.0–400.0)
RBC: 4.51 Mil/uL (ref 3.87–5.11)
RDW: 14.5 % (ref 11.5–15.5)
WBC: 6.9 10*3/uL (ref 4.0–10.5)

## 2017-06-25 LAB — COMPREHENSIVE METABOLIC PANEL
ALK PHOS: 74 U/L (ref 39–117)
ALT: 18 U/L (ref 0–35)
AST: 18 U/L (ref 0–37)
Albumin: 4.2 g/dL (ref 3.5–5.2)
BUN: 10 mg/dL (ref 6–23)
CO2: 31 meq/L (ref 19–32)
Calcium: 9.8 mg/dL (ref 8.4–10.5)
Chloride: 101 mEq/L (ref 96–112)
Creatinine, Ser: 0.88 mg/dL (ref 0.40–1.20)
GFR: 84.46 mL/min (ref >60.00)
GLUCOSE: 98 mg/dL (ref 70–99)
POTASSIUM: 3.9 meq/L (ref 3.5–5.1)
SODIUM: 139 meq/L (ref 135–145)
Total Bilirubin: 0.6 mg/dL (ref 0.2–1.2)
Total Protein: 7.5 g/dL (ref 6.0–8.3)

## 2017-06-25 LAB — LIPID PANEL
CHOLESTEROL: 169 mg/dL (ref 0–200)
HDL: 86.7 mg/dL (ref >39.00)
LDL CALC: 72 mg/dL (ref 0–99)
NONHDL: 82.15
Total CHOL/HDL Ratio: 2
Triglycerides: 50 mg/dL (ref 0.0–149.0)
VLDL: 10 mg/dL (ref 0.0–40.0)

## 2017-06-25 LAB — TSH: TSH: 0.72 u[IU]/mL (ref 0.35–4.50)

## 2017-06-25 LAB — HEMOGLOBIN A1C: HEMOGLOBIN A1C: 5.9 % (ref 4.6–6.5)

## 2017-06-25 NOTE — Assessment & Plan Note (Signed)
Check a1c Low sugar / carb diet Stressed regular exercise, weight loss  

## 2017-06-25 NOTE — Assessment & Plan Note (Signed)
Managed by gyn dexa up to date Walking for exercise Taking MVI

## 2017-06-26 ENCOUNTER — Encounter: Payer: Self-pay | Admitting: Internal Medicine

## 2017-06-26 DIAGNOSIS — R7303 Prediabetes: Secondary | ICD-10-CM | POA: Insufficient documentation

## 2017-07-02 ENCOUNTER — Encounter: Payer: Self-pay | Admitting: Internal Medicine

## 2017-07-08 ENCOUNTER — Encounter: Payer: Self-pay | Admitting: Internal Medicine

## 2017-08-15 ENCOUNTER — Ambulatory Visit (INDEPENDENT_AMBULATORY_CARE_PROVIDER_SITE_OTHER): Payer: BLUE CROSS/BLUE SHIELD | Admitting: Family Medicine

## 2017-08-15 ENCOUNTER — Encounter: Payer: Self-pay | Admitting: Family Medicine

## 2017-08-15 VITALS — BP 142/82 | HR 86 | Temp 98.2°F | Ht 66.0 in | Wt 199.0 lb

## 2017-08-15 DIAGNOSIS — J069 Acute upper respiratory infection, unspecified: Secondary | ICD-10-CM

## 2017-08-15 NOTE — Patient Instructions (Signed)
Please try things such as zyrtec-D or allegra-D which is an antihistamine and decongestant.   Please try afrin which will help with nasal congestion but use for only three days.   Please also try using a netti pot on a regular occasion.  Honey can help with a sore throat.   

## 2017-08-15 NOTE — Progress Notes (Signed)
Sylvia Mcintosh - 62 y.o. female MRN [id number]  Date of birth: [birth year]  SUBJECTIVE:  Including CC & ROS.  Chief Complaint  Patient presents with  . Sore Throat    Sylvia Mcintosh is a 62 y.o. female that is presenting with a sore throat and cough. Symptoms have been ongoing for four days. Denies fever. Admits to body aches. She has been taking Delsym and tylenol cold and flu with no improvement. Denies shortness of breath. She has been around a coworker with similar symptoms. 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    Review of Systems  Constitutional: Positive for activity change. Negative for fever.  HENT: Positive for sore throat.   Respiratory: Positive for cough.     HISTORY: Past Medical, Surgical, Social, and Family History Reviewed & Updated per EMR.   Pertinent Historical Findings include:  Past Medical History:  Diagnosis Date  . Allergy    Seasonal rhinoconjunctivitis with voice loss  . Depression 2012   work stress related  . Mitral valve prolapse 1984   2 D ECHO  . Nonspecific abnormal electrocardiogram (ECG) (EKG) 2004   Negative nuclear stress test  . Osteopenia 10/2014   T score -1.5 FRAX 2.5%/0.1%. No significant change from prior DEXA.  Marland Kitchen Vitamin D deficiency     Past Surgical History:  Procedure Laterality Date  . BUNIONECTOMY    . COLONOSCOPY  2012   Negative; [place]  . ECTOPIC PREGNANCY SURGERY    . HAMMER TOE SURGERY     Bilateral  . MOUTH SURGERY     Implants  . TUBAL LIGATION    . Tubal Reversal      Allergies  Allergen Reactions  . Citalopram     "made me paranoid"    Family History  Problem Relation Age of Onset  . Breast cancer Mother 56  . Hypertension Sister   . Diabetes Sister        diet & exercise controlled  . Hypertension Brother   . Heart attack Maternal Grandmother 70       Also stroke, hypertension  . Stroke Maternal Grandmother        in 65s  . Breast cancer Maternal Aunt 75  . Hypertension Daughter   .  Diabetes Father   . Heart murmur Father        two valves replaced  . Prostate cancer Father      Social History   Socioeconomic History  . Marital status: Married    Spouse name: Not on file  . Number of children: Not on file  . Years of education: Not on file  . Highest education level: Not on file  Occupational History  . Not on file  Social Needs  . Financial resource strain: Not on file  . Food insecurity:    Worry: Not on file    Inability: Not on file  . Transportation needs:    Medical: Not on file    Non-medical: Not on file  Tobacco Use  . Smoking status: Never Smoker  . Smokeless tobacco: Never Used  Substance and Sexual Activity  . Alcohol use: No    Alcohol/week: 0.0 oz  . Drug use: No  . Sexual activity: Yes    Birth control/protection: Post-menopausal    Comment: 1st intercourse 51 yo-1 partner  Lifestyle  . Physical activity:    Days per week: Not on file    Minutes per session: Not on file  . Stress: Not on  file  Relationships  . Social connections:    Talks on phone: Not on file    Gets together: Not on file    Attends religious service: Not on file    Active member of club or organization: Not on file    Attends meetings of clubs or organizations: Not on file    Relationship status: Not on file  . Intimate partner violence:    Fear of current or ex partner: Not on file    Emotionally abused: Not on file    Physically abused: Not on file    Forced sexual activity: Not on file  Other Topics Concern  . Not on file  Social History Narrative  . Not on file     PHYSICAL EXAM:  VS: BP (!) 142/82 (BP Location: Right Arm, Patient Position: Sitting, Cuff Size: Normal)   Pulse 86   Temp 98.2 F (36.8 C) (Oral)   Ht  (1.676 m)   Wt 199 lb (90.3 kg)   SpO2 99%   BMI 32.12 kg/m  Physical Exam Gen: NAD, alert, cooperative with exam, ENT: normal lips, normal nasal mucosa, tympanic membranes clear and intact bilaterally, normal  oropharynx,  Eye: normal EOM, normal conjunctiva and lids CV:  no edema, +2 pedal pulses, regular rate and rhythm, S1-S2   Resp: no accessory muscle use, non-labored, clear to auscultation bilaterally, no crackles or wheezes Skin: no rashes, no areas of induration  Neuro: normal tone, normal sensation to touch Psych:  normal insight, alert and oriented MSK: Normal gait, normal strength       ASSESSMENT & PLAN:   Upper respiratory tract infection Symptoms likely viral in nature.  - counseled on supportive care  - given indications to follow up

## 2017-08-15 NOTE — Assessment & Plan Note (Signed)
Symptoms likely viral in nature.  - counseled on supportive care  - given indications to follow up.  

## 2017-12-06 ENCOUNTER — Encounter: Payer: BLUE CROSS/BLUE SHIELD | Admitting: Gynecology

## 2017-12-11 ENCOUNTER — Encounter: Payer: BLUE CROSS/BLUE SHIELD | Admitting: Gynecology

## 2017-12-12 ENCOUNTER — Encounter: Payer: BLUE CROSS/BLUE SHIELD | Admitting: Gynecology

## 2018-02-05 ENCOUNTER — Encounter: Payer: Self-pay | Admitting: Gynecology

## 2018-02-05 ENCOUNTER — Ambulatory Visit (INDEPENDENT_AMBULATORY_CARE_PROVIDER_SITE_OTHER): Admitting: Gynecology

## 2018-02-05 VITALS — BP 124/78 | Ht 66.0 in | Wt 207.0 lb

## 2018-02-05 DIAGNOSIS — Z01419 Encounter for gynecological examination (general) (routine) without abnormal findings: Secondary | ICD-10-CM

## 2018-02-05 DIAGNOSIS — Z1151 Encounter for screening for human papillomavirus (HPV): Secondary | ICD-10-CM | POA: Diagnosis not present

## 2018-02-05 DIAGNOSIS — M8580 Other specified disorders of bone density and structure, unspecified site: Secondary | ICD-10-CM

## 2018-02-05 DIAGNOSIS — N952 Postmenopausal atrophic vaginitis: Secondary | ICD-10-CM | POA: Diagnosis not present

## 2018-02-05 NOTE — Progress Notes (Signed)
    Sylvia Mcintosh April 28, 1955 [id number]        62 y.o.  [id number] for annual gynecologic exam.  Without gynecologic complaints  Past medical history,surgical history, problem list, medications, allergies, family history and social history were all reviewed and documented as reviewed in the EPIC chart.  ROS:  Performed with pertinent positives and negatives included in the history, assessment and plan.   Additional significant findings : None   Exam: Kennon Portela assistant Vitals:   02/05/18 0913  BP: 124/78  Weight: 207 lb (93.9 kg)  Height: 5\' 6"  (1.676 m)   Body mass index is 33.41 kg/m.  General appearance:  Normal affect, orientation and appearance. Skin: Grossly normal HEENT: Without gross lesions.  No cervical or supraclavicular adenopathy. Thyroid normal.  Lungs:  Clear without wheezing, rales or rhonchi Cardiac: RR, without RMG Abdominal:  Soft, nontender, without masses, guarding, rebound, organomegaly or hernia Breasts:  Examined lying and sitting without masses, retractions, discharge or axillary adenopathy. Pelvic:  Ext, BUS, Vagina: With atrophic changes  Cervix: With atrophic changes.  Pap smear/HPV  Uterus: Anteverted, normal size, shape and contour, midline and mobile nontender   Adnexa: Without masses or tenderness    Anus and perineum: Normal   Rectovaginal: Normal sphincter tone without palpated masses or tenderness.    Assessment/Plan:  62 y.o. Sylvia Mcintosh female for annual gynecologic exam.   1. Postmenopausal.  No significant menopausal symptoms or any vaginal bleeding. 2. DEXA 2019 T score -1.6.  Stable from prior DEXA. 3. Mammography 05/2017.  Continue with annual mammography when due.  Breast exam normal today. 4. Colonoscopy 2011 with reported repeat interval 10 years. 5. Pap smear/HPV 2015.  Pap smear/HPV today.  No history of abnormal Pap smears previously. 6. Health maintenance.  We discussed weight loss and exercise programs.  Recommended  weight watchers type program.  No routine blood work done as patient does this elsewhere.  Follow-up 1 year, sooner as needed.   [name], 9:49 AM 02/05/2018

## 2018-02-05 NOTE — Patient Instructions (Signed)
Follow-up in 1 year for annual exam, sooner as needed. 

## 2018-02-05 NOTE — Addendum Note (Signed)
Addended by: Dayna Barker on: 02/05/2018 10:11 AM   Modules accepted: Orders

## 2018-02-06 LAB — PAP IG AND HPV HIGH-RISK: HPV DNA HIGH RISK: NOT DETECTED

## 2018-04-09 DIAGNOSIS — H269 Unspecified cataract: Secondary | ICD-10-CM

## 2018-04-09 HISTORY — DX: Unspecified cataract: H26.9

## 2018-04-09 HISTORY — PX: OTHER SURGICAL HISTORY: SHX169

## 2018-04-18 ENCOUNTER — Other Ambulatory Visit: Payer: Self-pay

## 2018-04-18 ENCOUNTER — Emergency Department (INDEPENDENT_AMBULATORY_CARE_PROVIDER_SITE_OTHER)
Admission: EM | Admit: 2018-04-18 | Discharge: 2018-04-18 | Disposition: A | Discharge status: Home / Self Care | Payer: Self-pay | Source: Home / Self Care

## 2018-04-18 DIAGNOSIS — M545 Low back pain, unspecified: Secondary | ICD-10-CM

## 2018-04-18 DIAGNOSIS — R51 Headache: Secondary | ICD-10-CM

## 2018-04-18 DIAGNOSIS — M549 Dorsalgia, unspecified: Secondary | ICD-10-CM

## 2018-04-18 DIAGNOSIS — R519 Headache, unspecified: Secondary | ICD-10-CM

## 2018-04-18 MED ORDER — CYCLOBENZAPRINE HCL 5 MG PO TABS: 5.0000 mg | ORAL_TABLET | Freq: Two times a day (BID) | ORAL | 0 refills | Status: DC | PRN

## 2018-04-18 NOTE — Discharge Instructions (Signed)
°  You may take 500mg  acetaminophen every 4-6 hours or in combination with ibuprofen 400-600mg  every 6-8 hours/or you may continue taking the Aleve as needed for pain and inflammation.   Flexeril (cyclobenzaprine) is a muscle relaxer and may cause drowsiness. Do not drink alcohol, drive, or operate heavy machinery while taking.  Please follow up with family medicine in 1-2 weeks if not improving.
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
## 2018-05-02 ENCOUNTER — Other Ambulatory Visit: Payer: Self-pay | Admitting: Gynecology

## 2018-05-02 ENCOUNTER — Encounter: Payer: Self-pay | Admitting: Internal Medicine

## 2018-05-02 ENCOUNTER — Ambulatory Visit (INDEPENDENT_AMBULATORY_CARE_PROVIDER_SITE_OTHER): Admitting: Internal Medicine

## 2018-05-02 VITALS — BP 122/78 | HR 67 | Temp 98.0°F | Resp 16 | Ht 66.0 in | Wt 198.1 lb

## 2018-05-02 DIAGNOSIS — M25511 Pain in right shoulder: Secondary | ICD-10-CM

## 2018-05-02 DIAGNOSIS — Z1231 Encounter for screening mammogram for malignant neoplasm of breast: Secondary | ICD-10-CM

## 2018-05-02 NOTE — Patient Instructions (Signed)
Schedule an appointment with Sports medicine.   Take aleve as needed.  Ice the shoulder.
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
## 2018-05-14 ENCOUNTER — Ambulatory Visit (INDEPENDENT_AMBULATORY_CARE_PROVIDER_SITE_OTHER): Admitting: Family Medicine

## 2018-05-14 ENCOUNTER — Encounter: Payer: Self-pay | Admitting: Family Medicine

## 2018-05-14 VITALS — BP 104/62 | HR 72 | Ht 66.0 in | Wt 196.0 lb

## 2018-05-14 DIAGNOSIS — M25511 Pain in right shoulder: Secondary | ICD-10-CM
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
## 2018-05-14 NOTE — Patient Instructions (Signed)
Good to see you  Tendonitis and bursitis but should do well  Ice 20 minutes 2 times daily. Usually after activity and before bed. pennsaid pinkie amount topically 2 times daily as needed.  Exercises 3 times a week.  See me again in 4 weeks to make sure you are good to go!

## 2018-06-03 ENCOUNTER — Ambulatory Visit
Admission: RE | Admit: 2018-06-03 | Discharge: 2018-06-03 | Disposition: A | Discharge status: Home / Self Care | Source: Ambulatory Visit | Attending: Gynecology | Admitting: Gynecology

## 2018-06-03 DIAGNOSIS — Z1231 Encounter for screening mammogram for malignant neoplasm of breast: Secondary | ICD-10-CM

## 2018-06-06 ENCOUNTER — Ambulatory Visit
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
## 2018-06-10 ENCOUNTER — Encounter: Payer: Self-pay | Admitting: Family Medicine

## 2018-06-10 ENCOUNTER — Ambulatory Visit (INDEPENDENT_AMBULATORY_CARE_PROVIDER_SITE_OTHER): Admitting: Family Medicine

## 2018-06-10 DIAGNOSIS — M25511 Pain in right shoulder: Secondary | ICD-10-CM | POA: Diagnosis not present

## 2018-06-10 NOTE — Patient Instructions (Signed)
Good to see you  I think you are at maximal medical improvement.  You should do great  See me only when you need me!

## 2018-06-10 NOTE — Assessment & Plan Note (Signed)
Patient is doing significantly better at this time.  Near patient's baseline.  I believe the patient is at maximal medical improvement.  Patient is going to do fine with conservative therapy and can follow-up as needed.

## 2018-06-11 ENCOUNTER — Ambulatory Visit: Admitting: Family Medicine

## 2018-07-01 ENCOUNTER — Encounter: Payer: BLUE CROSS/BLUE SHIELD | Admitting: Internal Medicine

## 2018-09-29 NOTE — Progress Notes (Signed)
Subjective:    Patient ID: Sylvia Mcintosh, female    DOB: 1956-03-27, 63 y.o.   MRN: [id number]  HPI She is here for a physical exam.    She has no concerns.  She has been walking regularly and has lost weight.  She is eating better as well.     Medications and allergies reviewed with patient and updated if appropriate.  Patient Active Problem List   Diagnosis Date Noted  . Acute pain of right shoulder 05/02/2018  . Prediabetes 06/26/2017  . Family history of diabetes mellitus (DM) 06/25/2017  . Nonspecific abnormal electrocardiogram (ECG) (EKG) 11/18/2013  . Osteopenia   . Seasonal allergies 04/22/2009    Current Outpatient Medications on File Prior to Visit  Medication Sig Dispense Refill  . Multiple Vitamins-Minerals (WOMENS MULTIVITAMIN PO) Take by mouth daily.     No current facility-administered medications on file prior to visit.     Past Medical History:  Diagnosis Date  . Allergy    Seasonal rhinoconjunctivitis with voice loss  . Depression 2012   work stress related  . Mitral valve prolapse 1984   2 D ECHO  . Nonspecific abnormal electrocardiogram (ECG) (EKG) 2004   Negative nuclear stress test  . Osteopenia 10/2014   T score -1.5 FRAX 2.5%/0.1%. No significant change from prior DEXA.  Marland Kitchen. Vitamin D deficiency     Past Surgical History:  Procedure Laterality Date  . BUNIONECTOMY    . COLONOSCOPY  2012   Negative; [place]  . ECTOPIC PREGNANCY SURGERY    . HAMMER TOE SURGERY     Bilateral  . MOUTH SURGERY     Implants  . TUBAL LIGATION    . Tubal Reversal      Social History   Socioeconomic History  . Marital status: Married    Spouse name: Not on file  . Number of children: Not on file  . Years of education: Not on file  . Highest education level: Not on file  Occupational History  . Not on file  Social Needs  . Financial resource strain: Not on file  . Food insecurity    Worry: Not on file    Inability: Not on file  .  Transportation needs    Medical: Not on file    Non-medical: Not on file  Tobacco Use  . Smoking status: Never Smoker  . Smokeless tobacco: Never Used  Substance and Sexual Activity  . Alcohol use: No    Alcohol/week: 0.0 standard drinks  . Drug use: No  . Sexual activity: Yes    Birth control/protection: Post-menopausal    Comment: 1st intercourse 63 yo-1 partner  Lifestyle  . Physical activity    Days per week: Not on file    Minutes per session: Not on file  . Stress: Not on file  Relationships  . Social Musicianconnections    Talks on phone: Not on file    Gets together: Not on file    Attends religious service: Not on file    Active member of club or organization: Not on file    Attends meetings of clubs or organizations: Not on file    Relationship status: Not on file  Other Topics Concern  . Not on file  Social History Narrative  . Not on file    Family History  Problem Relation Age of Onset  . Breast cancer Mother 4041  . Hypertension Sister   . Diabetes Sister  diet & exercise controlled  . Hypertension Brother   . Heart attack Maternal Grandmother 70       Also stroke, hypertension  . Stroke Maternal Grandmother        in 70s  . Breast cancer Maternal Aunt 75  . Hypertension Daughter   . Diabetes Father   . Heart murmur Father        two valves replaced  . Prostate cancer Father     Review of Systems  Constitutional: Negative for chills and fever.  Eyes: Negative for visual disturbance.  Respiratory: Negative for cough, shortness of breath and wheezing.   Cardiovascular: Negative for chest pain, palpitations and leg swelling.  Gastrointestinal: Negative for abdominal pain, blood in stool, constipation, diarrhea and nausea.       No gerd  Genitourinary: Negative for dysuria and hematuria.  Musculoskeletal: Positive for arthralgias (knee). Negative for back pain.  Skin: Negative for color change and rash.       alopecia  Neurological: Negative for  dizziness, light-headedness, numbness and headaches.  Psychiatric/Behavioral: Negative for dysphoric mood. The patient is not nervous/anxious.        Objective:   Vitals:   10/01/18 0757  BP: 116/72  Pulse: 61  Resp: 16  Temp: 98.2 F (36.8 C)  SpO2: 99%   Filed Weights   10/01/18 0757  Weight: 189 lb (85.7 kg)   Body mass index is 30.51 kg/m.  BP Readings from Last 3 Encounters:  10/01/18 116/72  06/10/18 128/90  05/14/18 104/62    Wt Readings from Last 3 Encounters:  10/01/18 189 lb (85.7 kg)  06/10/18 204 lb (92.5 kg)  05/14/18 196 lb (88.9 kg)     Physical Exam Constitutional: She appears well-developed and well-nourished. No distress.  HENT:  Head: Normocephalic and atraumatic.  Right Ear: External ear normal. Normal ear canal and TM Left Ear: External ear normal.  Normal ear canal and TM Mouth/Throat: Oropharynx is clear and moist.  Eyes: Conjunctivae and EOM are normal.  Neck: Neck supple. No tracheal deviation present. No thyromegaly present.  No carotid bruit  Cardiovascular: Normal rate, regular rhythm and normal heart sounds.   No murmur heard.  No edema. Pulmonary/Chest: Effort normal and breath sounds normal. No respiratory distress. She has no wheezes. She has no rales.  Breast: deferred   Abdominal: Soft. She exhibits no distension. There is no tenderness.  Lymphadenopathy: She has no cervical adenopathy.  Skin: Skin is warm and dry. She is not diaphoretic.  Psychiatric: She has a normal mood and affect. Her behavior is normal.        Assessment & Plan:   Physical exam: Screening blood work ordered Immunizations   Discussed shingrix, others up to date Colonoscopy   Up to date  Mammogram  Up to date  Gyn   Up to date  Dexa   Up to date  Eye exams   up to date Exercise  walking Weight   Has lost weight - encouraged to continue weight loss efforts Skin  Some hair loss on top of head Substance abuse   none  See Problem List for  Assessment and Plan of chronic medical problems.   FU in one year

## 2018-09-29 NOTE — Patient Instructions (Addendum)
[facility] [place] [phone] [address] [place], [place] [address] [phone]   Tests ordered today. Your results will be released to [place] (or called to you) after review, usually within 72hours after test completion. If any changes need to be made, you will be notified at that same time.  All other Health Maintenance issues reviewed.   All recommended immunizations and age-appropriate screenings are up-to-date or discussed.  Shingles immunization administered today.   Medications reviewed and updated.  Changes include :   none     Please followup in one year   Health Maintenance, Female Adopting a healthy lifestyle and getting preventive care can go a long way to promote health and wellness. Talk with your health care provider about what schedule of regular examinations is right for you. This is a good chance for you to check in with your provider about disease prevention and staying healthy. In between checkups, there are plenty of things you can do on your own. Experts have done a lot of research about which lifestyle changes and preventive measures are most likely to keep you healthy. Ask your health care provider for more information. Weight and diet Eat a healthy diet  Be sure to include plenty of vegetables, fruits, low-fat dairy products, and lean protein.  Do not eat a lot of foods high in solid fats, added sugars, or salt.  Get regular exercise. This is one of the most important things you can do for your health. ? Most adults should exercise for at least 150 minutes each week. The exercise should increase your heart rate and make you sweat (moderate-intensity exercise). ? Most adults should also do strengthening exercises at least twice a week. This is in addition to the moderate-intensity exercise. Maintain a healthy weight  Body mass index (BMI) is a measurement that can be used to identify possible  weight problems. It estimates body fat based on height and weight. Your health care provider can help determine your BMI and help you achieve or maintain a healthy weight.  For females 13 years of age and older: ? A BMI below 18.5 is considered underweight. ? A BMI of 18.5 to 24.9 is normal. ? A BMI of 25 to 29.9 is considered overweight. ? A BMI of 30 and above is considered obese. Watch levels of cholesterol and blood lipids  You should start having your blood tested for lipids and cholesterol at 61 years of age, then have this test every 5 years.  You may need to have your cholesterol levels checked more often if: ? Your lipid or cholesterol levels are high. ? You are older than 61 years of age. ? You are at high risk for heart disease. Cancer screening Lung Cancer  Lung cancer screening is recommended for adults 38-108 years old who are at high risk for lung cancer because of a history of smoking.  A yearly low-dose CT scan of the lungs is recommended for people who: ? Currently smoke. ? Have quit within the past 15 years. ? Have at least a 30-pack-year history of smoking. A pack year is smoking an average of one pack of cigarettes a day for 1 year.  Yearly screening should continue until it has been 15 years since you quit.  Yearly screening should stop if you develop a health problem that would prevent you from having lung cancer treatment. Breast Cancer  Practice breast self-awareness. This  means understanding how your breasts normally appear and feel.  It also means doing regular breast self-exams. Let your health care provider know about any changes, no matter how small.  If you are in your 20s or 30s, you should have a clinical breast exam (CBE) by a health care provider every 1-3 years as part of a regular health exam.  If you are 77 or older, have a CBE every year. Also consider having a breast X-ray (mammogram) every year.  If you have a family history of breast  cancer, talk to your health care provider about genetic screening.  If you are at high risk for breast cancer, talk to your health care provider about having an MRI and a mammogram every year.  Breast cancer gene (BRCA) assessment is recommended for women who have family members with BRCA-related cancers. BRCA-related cancers include: ? Breast. ? Ovarian. ? Tubal. ? Peritoneal cancers.  Results of the assessment will determine the need for genetic counseling and BRCA1 and BRCA2 testing. Cervical Cancer Your health care provider may recommend that you be screened regularly for cancer of the pelvic organs (ovaries, uterus, and vagina). This screening involves a pelvic examination, including checking for microscopic changes to the surface of your cervix (Pap test). You may be encouraged to have this screening done every 3 years, beginning at age 74.  For women ages 62-65, health care providers may recommend pelvic exams and Pap testing every 3 years, or they may recommend the Pap and pelvic exam, combined with testing for human papilloma virus (HPV), every 5 years. Some types of HPV increase your risk of cervical cancer. Testing for HPV may also be done on women of any age with unclear Pap test results.  Other health care providers may not recommend any screening for nonpregnant women who are considered low risk for pelvic cancer and who do not have symptoms. Ask your health care provider if a screening pelvic exam is right for you.  If you have had past treatment for cervical cancer or a condition that could lead to cancer, you need Pap tests and screening for cancer for at least 20 years after your treatment. If Pap tests have been discontinued, your risk factors (such as having a new sexual partner) need to be reassessed to determine if screening should resume. Some women have medical problems that increase the chance of getting cervical cancer. In these cases, your health care provider may  recommend more frequent screening and Pap tests. Colorectal Cancer  This type of cancer can be detected and often prevented.  Routine colorectal cancer screening usually begins at 61 years of age and continues through 61 years of age.  Your health care provider may recommend screening at an earlier age if you have risk factors for colon cancer.  Your health care provider may also recommend using home test kits to check for hidden blood in the stool.  A small camera at the end of a tube can be used to examine your colon directly (sigmoidoscopy or colonoscopy). This is done to check for the earliest forms of colorectal cancer.  Routine screening usually begins at age 65.  Direct examination of the colon should be repeated every 5-10 years through 61 years of age. However, you may need to be screened more often if early forms of precancerous polyps or small growths are found. Skin Cancer  Check your skin from head to toe regularly.  Tell your health care provider about any new moles or changes  in moles, especially if there is a change in a mole's shape or color.  Also tell your health care provider if you have a mole that is larger than the size of a pencil eraser.  Always use sunscreen. Apply sunscreen liberally and repeatedly throughout the day.  Protect yourself by wearing long sleeves, pants, a wide-brimmed hat, and sunglasses whenever you are outside. Heart disease, diabetes, and high blood pressure  High blood pressure causes heart disease and increases the risk of stroke. High blood pressure is more likely to develop in: ? People who have blood pressure in the high end of the normal range (130-139/85-89 mm Hg). ? People who are overweight or obese. ? People who are African American.  If you are 51-32 years of age, have your blood pressure checked every 3-5 years. If you are 65 years of age or older, have your blood pressure checked every year. You should have your blood pressure  measured twice-once when you are at a hospital or clinic, and once when you are not at a hospital or clinic. Record the average of the two measurements. To check your blood pressure when you are not at a hospital or clinic, you can use: ? An automated blood pressure machine at a pharmacy. ? A home blood pressure monitor.  If you are between 33 years and 64 years old, ask your health care provider if you should take aspirin to prevent strokes.  Have regular diabetes screenings. This involves taking a blood sample to check your fasting blood sugar level. ? If you are at a normal weight and have a low risk for diabetes, have this test once every three years after 61 years of age. ? If you are overweight and have a high risk for diabetes, consider being tested at a younger age or more often. Preventing infection Hepatitis B  If you have a higher risk for hepatitis B, you should be screened for this virus. You are considered at high risk for hepatitis B if: ? You were born in a country where hepatitis B is common. Ask your health care provider which countries are considered high risk. ? Your parents were born in a high-risk country, and you have not been immunized against hepatitis B (hepatitis B vaccine). ? You have HIV or AIDS. ? You use needles to inject street drugs. ? You live with someone who has hepatitis B. ? You have had sex with someone who has hepatitis B. ? You get hemodialysis treatment. ? You take certain medicines for conditions, including cancer, organ transplantation, and autoimmune conditions. Hepatitis C  Blood testing is recommended for: ? Everyone born from [birth year] through 1965. ? Anyone with known risk factors for hepatitis C. Sexually transmitted infections (STIs)  You should be screened for sexually transmitted infections (STIs) including gonorrhea and chlamydia if: ? You are sexually active and are younger than 61 years of age. ? You are older than 61 years of age and  your health care provider tells you that you are at risk for this type of infection. ? Your sexual activity has changed since you were last screened and you are at an increased risk for chlamydia or gonorrhea. Ask your health care provider if you are at risk.  If you do not have HIV, but are at risk, it may be recommended that you take a prescription medicine daily to prevent HIV infection. This is called pre-exposure prophylaxis (PrEP). You are considered at risk if: ? You are sexually active  and do not regularly use condoms or know the HIV status of your partner(s). ? You take drugs by injection. ? You are sexually active with a partner who has HIV. Talk with your health care provider about whether you are at high risk of being infected with HIV. If you choose to begin PrEP, you should first be tested for HIV. You should then be tested every 3 months for as long as you are taking PrEP. Pregnancy  If you are premenopausal and you may become pregnant, ask your health care provider about preconception counseling.  If you may become pregnant, take 400 to 800 micrograms (mcg) of folic acid every day.  If you want to prevent pregnancy, talk to your health care provider about birth control (contraception). Osteoporosis and menopause  Osteoporosis is a disease in which the bones lose minerals and strength with aging. This can result in serious bone fractures. Your risk for osteoporosis can be identified using a bone density scan.  If you are 20 years of age or older, or if you are at risk for osteoporosis and fractures, ask your health care provider if you should be screened.  Ask your health care provider whether you should take a calcium or vitamin D supplement to lower your risk for osteoporosis.  Menopause may have certain physical symptoms and risks.  Hormone replacement therapy may reduce some of these symptoms and risks. Talk to your health care provider about whether hormone replacement  therapy is right for you. Follow these instructions at home:  Schedule regular health, dental, and eye exams.  Stay current with your immunizations.  Do not use any tobacco products including cigarettes, chewing tobacco, or electronic cigarettes.  If you are pregnant, do not drink alcohol.  If you are breastfeeding, limit how much and how often you drink alcohol.  Limit alcohol intake to no more than 1 drink per day for nonpregnant women. One drink equals 12 ounces of beer, 5 ounces of wine, or 1 ounces of hard liquor.  Do not use street drugs.  Do not share needles.  Ask your health care provider for help if you need support or information about quitting drugs.  Tell your health care provider if you often feel depressed.  Tell your health care provider if you have ever been abused or do not feel safe at home. This information is not intended to replace advice given to you by your health care provider. Make sure you discuss any questions you have with your health care provider. Document Released: 10/09/2010 Document Revised: 09/01/2015 Document Reviewed: 12/28/2014 Elsevier Interactive Patient Education  2019 [employer].

## 2018-10-01 ENCOUNTER — Ambulatory Visit (INDEPENDENT_AMBULATORY_CARE_PROVIDER_SITE_OTHER): Admitting: Internal Medicine

## 2018-10-01 ENCOUNTER — Encounter: Payer: Self-pay | Admitting: Internal Medicine

## 2018-10-01 ENCOUNTER — Other Ambulatory Visit (INDEPENDENT_AMBULATORY_CARE_PROVIDER_SITE_OTHER)

## 2018-10-01 ENCOUNTER — Other Ambulatory Visit: Payer: Self-pay

## 2018-10-01 VITALS — BP 116/72 | HR 61 | Temp 98.2°F | Resp 16 | Ht 66.0 in | Wt 189.0 lb

## 2018-10-01 DIAGNOSIS — R7303 Prediabetes: Secondary | ICD-10-CM | POA: Diagnosis not present

## 2018-10-01 DIAGNOSIS — M8580 Other specified disorders of bone density and structure, unspecified site: Secondary | ICD-10-CM

## 2018-10-01 DIAGNOSIS — Z23 Encounter for immunization: Secondary | ICD-10-CM

## 2018-10-01 DIAGNOSIS — Z0000 Encounter for general adult medical examination without abnormal findings: Secondary | ICD-10-CM

## 2018-10-01 LAB — COMPREHENSIVE METABOLIC PANEL
ALT: 13 U/L (ref 0–35)
AST: 16 U/L (ref 0–37)
Albumin: 4 g/dL (ref 3.5–5.2)
Alkaline Phosphatase: 77 U/L (ref 39–117)
BUN: 16 mg/dL (ref 6–23)
CO2: 30 mEq/L (ref 19–32)
Calcium: 9.2 mg/dL (ref 8.4–10.5)
Chloride: 104 mEq/L (ref 96–112)
Creatinine, Ser: 1.07 mg/dL (ref 0.40–1.20)
GFR: 63.15 mL/min (ref >60.00)
Glucose, Bld: 102 mg/dL — ABNORMAL HIGH (ref 70–99)
Potassium: 4.4 mEq/L (ref 3.5–5.1)
Sodium: 140 mEq/L (ref 135–145)
Total Bilirubin: 0.5 mg/dL (ref 0.2–1.2)
Total Protein: 7.3 g/dL (ref 6.0–8.3)

## 2018-10-01 LAB — LIPID PANEL
Cholesterol: 189 mg/dL (ref 0–200)
HDL: 78.8 mg/dL (ref >39.00)
LDL Cholesterol: 95 mg/dL (ref 0–99)
NonHDL: 110.27
Total CHOL/HDL Ratio: 2
Triglycerides: 78 mg/dL (ref 0.0–149.0)
VLDL: 15.6 mg/dL (ref 0.0–40.0)

## 2018-10-01 LAB — CBC WITH DIFFERENTIAL/PLATELET
Basophils Absolute: 0.1 10*3/uL (ref 0.0–0.1)
Basophils Relative: 1.5 % (ref 0.0–3.0)
Eosinophils Absolute: 0.1 10*3/uL (ref 0.0–0.7)
Eosinophils Relative: 1 % (ref 0.0–5.0)
HCT: 40 % (ref 36.0–46.0)
Hemoglobin: 13.1 g/dL (ref 12.0–15.0)
Lymphocytes Relative: 35.5 % (ref 12.0–46.0)
Lymphs Abs: 2.4 10*3/uL (ref 0.7–4.0)
MCHC: 32.8 g/dL (ref 30.0–36.0)
MCV: 89.7 fl (ref 78.0–100.0)
Monocytes Absolute: 0.2 10*3/uL (ref 0.1–1.0)
Monocytes Relative: 3.4 % (ref 3.0–12.0)
Neutro Abs: 4 10*3/uL (ref 1.4–7.7)
Neutrophils Relative %: 58.6 % (ref 43.0–77.0)
Platelets: 185 10*3/uL (ref 150.0–400.0)
RBC: 4.45 Mil/uL (ref 3.87–5.11)
RDW: 14.6 % (ref 11.5–15.5)
WBC: 6.9 10*3/uL (ref 4.0–10.5)

## 2018-10-01 LAB — TSH: TSH: 1.49 u[IU]/mL (ref 0.35–4.50)

## 2018-10-01 LAB — HEMOGLOBIN A1C: Hgb A1c MFr Bld: 5.9 % (ref 4.6–6.5)

## 2018-10-01 NOTE — Assessment & Plan Note (Addendum)
dexa up to date Walking Taking a MVI Check cbc, cmp, tsh

## 2018-10-01 NOTE — Assessment & Plan Note (Signed)
Check a1c Low sugar / carb diet Continue regular exercise  

## 2019-01-01 ENCOUNTER — Ambulatory Visit (INDEPENDENT_AMBULATORY_CARE_PROVIDER_SITE_OTHER)

## 2019-01-01 ENCOUNTER — Other Ambulatory Visit: Payer: Self-pay

## 2019-01-01 ENCOUNTER — Ambulatory Visit

## 2019-01-01 DIAGNOSIS — Z23 Encounter for immunization: Secondary | ICD-10-CM | POA: Diagnosis not present

## 2019-01-01 DIAGNOSIS — Z299 Encounter for prophylactic measures, unspecified: Secondary | ICD-10-CM

## 2019-01-06 ENCOUNTER — Encounter: Payer: Self-pay | Admitting: Gynecology

## 2019-01-17 ENCOUNTER — Other Ambulatory Visit: Payer: Self-pay

## 2019-01-17 ENCOUNTER — Ambulatory Visit (INDEPENDENT_AMBULATORY_CARE_PROVIDER_SITE_OTHER)

## 2019-01-17 DIAGNOSIS — Z23 Encounter for immunization: Secondary | ICD-10-CM | POA: Diagnosis not present

## 2019-02-11 ENCOUNTER — Other Ambulatory Visit: Payer: Self-pay

## 2019-02-12 ENCOUNTER — Encounter: Payer: Self-pay | Admitting: Gynecology

## 2019-02-12 ENCOUNTER — Ambulatory Visit (INDEPENDENT_AMBULATORY_CARE_PROVIDER_SITE_OTHER): Admitting: Gynecology

## 2019-02-12 VITALS — BP 120/70 | Ht 66.0 in | Wt 198.0 lb

## 2019-02-12 DIAGNOSIS — M8580 Other specified disorders of bone density and structure, unspecified site: Secondary | ICD-10-CM | POA: Diagnosis not present

## 2019-02-12 DIAGNOSIS — Z01419 Encounter for gynecological examination (general) (routine) without abnormal findings: Secondary | ICD-10-CM | POA: Diagnosis not present

## 2019-02-12 DIAGNOSIS — N952 Postmenopausal atrophic vaginitis: Secondary | ICD-10-CM | POA: Diagnosis not present

## 2019-02-12 NOTE — Progress Notes (Signed)
    Taline Nass February 28, 1956 [id number]        63 y.o.  [id number] for annual gynecologic exam.  Without gynecologic complaints  Past medical history,surgical history, problem list, medications, allergies, family history and social history were all reviewed and documented as reviewed in the EPIC chart.  ROS:  Performed with pertinent positives and negatives included in the history, assessment and plan.   Additional significant findings : None   Exam: Caryn Bee assistant Vitals:   02/12/19 0851  BP: 120/70  Weight: 198 lb (89.8 kg)  Height: 5\' 6"  (1.676 m)   Body mass index is 31.96 kg/m.  General appearance:  Normal affect, orientation and appearance. Skin: Grossly normal HEENT: Without gross lesions.  No cervical or supraclavicular adenopathy. Thyroid normal.  Lungs:  Clear without wheezing, rales or rhonchi Cardiac: RR, without RMG Abdominal:  Soft, nontender, without masses, guarding, rebound, organomegaly or hernia Breasts:  Examined lying and sitting without masses, retractions, discharge or axillary adenopathy. Pelvic:  Ext, BUS, Vagina: With atrophic changes  Cervix: With atrophic changes  Uterus: Anteverted, normal size, shape and contour, midline and mobile nontender   Adnexa: Without masses or tenderness    Anus and perineum: Normal   Rectovaginal: Normal sphincter tone without palpated masses or tenderness.    Assessment/Plan:  63 y.o. [id number] female for annual gynecologic exam.   1. Postmenopausal/atrophic genital changes.  Without significant menopausal symptoms or any vaginal bleeding. 2. Osteopenia.  DEXA 2019 T score -1.6 stable from prior DEXA.  Plan repeat DEXA next year. 3. Colonoscopy coming due and the patient is going to arrang. 4. Mammography 05/2018.  Continue with annual mammography when due.  Breast exam normal today. 5. Pap smear/HPV 2019.  No Pap smear done today.  No history of abnormal Pap smears.  Plan repeat Pap smear/HPV at 5-year  interval per current screening guidelines. 6. Health maintenance.  No routine lab work done as patient does this elsewhere.  Follow-up 1 year, sooner as needed.   Anastasio Auerbach MD, 9:16 AM 02/12/2019

## 2019-02-12 NOTE — Patient Instructions (Signed)
Schedule your colonoscopy  Follow-up in 1 year for annual exam 

## 2019-05-12 ENCOUNTER — Other Ambulatory Visit: Payer: Self-pay | Admitting: Internal Medicine

## 2019-05-12 DIAGNOSIS — Z1231 Encounter for screening mammogram for malignant neoplasm of breast: Secondary | ICD-10-CM

## 2019-06-09 ENCOUNTER — Ambulatory Visit

## 2019-07-08 ENCOUNTER — Ambulatory Visit
Admission: RE | Admit: 2019-07-08 | Discharge: 2019-07-08 | Disposition: A | Discharge status: Home / Self Care | Source: Ambulatory Visit | Attending: Internal Medicine | Admitting: Internal Medicine

## 2019-07-08 ENCOUNTER — Other Ambulatory Visit: Payer: Self-pay

## 2019-07-08 DIAGNOSIS — Z1231 Encounter for screening mammogram for malignant neoplasm of breast: Secondary | ICD-10-CM

## 2019-09-22 ENCOUNTER — Encounter: Payer: Self-pay | Admitting: Gastroenterology

## 2019-10-02 ENCOUNTER — Encounter: Admitting: Internal Medicine

## 2019-10-04 NOTE — Progress Notes (Signed)
Subjective:    Patient ID: Sylvia Mcintosh, female    DOB: 1955-11-29, 64 y.o.   MRN: [id number]  HPI She is here for a physical exam.   She wants to lose weight.  She did lose weight with walking and watching what she was eating - lost 20 lbs.  She then gained some of it back.  She feels like she is at a standstill.  She walks a good amount and she feels she is eating a healthy diet and does not overeat.  She is frustrated that she is not able to lose weight and wants to avoid medications in the future and knows that weight loss will help.  She does go to the [facility] and they have prescribed Xenical, but she does not want to take this-she prefers the new weight loss injectable medication  She walks several times a week.   (4-8 miles about 6 times a week).  Her diet is good - she eats well.      Medications and allergies reviewed with patient and updated if appropriate.  Patient Active Problem List   Diagnosis Date Noted  . Acute pain of right shoulder 05/02/2018  . Prediabetes 06/26/2017  . Family history of diabetes mellitus (DM) 06/25/2017  . Nonspecific abnormal electrocardiogram (ECG) (EKG) 11/18/2013  . Osteopenia   . Seasonal allergies 04/22/2009    Current Outpatient Medications on File Prior to Visit  Medication Sig Dispense Refill  . Multiple Vitamins-Minerals (WOMENS MULTIVITAMIN PO) Take by mouth daily.     No current facility-administered medications on file prior to visit.    Past Medical History:  Diagnosis Date  . Allergy    Seasonal rhinoconjunctivitis with voice loss  . Cataract   . Depression 2012   work stress related  . Mitral valve prolapse 1984   2 D ECHO  . Nonspecific abnormal electrocardiogram (ECG) (EKG) 2004   Negative nuclear stress test  . Osteopenia 10/2014   T score -1.5 FRAX 2.5%/0.1%. No significant change from prior DEXA.  Marland Kitchen Vitamin D deficiency     Past Surgical History:  Procedure Laterality Date  . BUNIONECTOMY    . cataract  surg    . COLONOSCOPY  2012   Negative; [facility]  . ECTOPIC PREGNANCY SURGERY    . HAMMER TOE SURGERY     Bilateral  . MOUTH SURGERY     Implants  . TUBAL LIGATION    . Tubal Reversal      Social History   Socioeconomic History  . Marital status: Married    Spouse name: Not on file  . Number of children: Not on file  . Years of education: Not on file  . Highest education level: Not on file  Occupational History  . Not on file  Tobacco Use  . Smoking status: Never Smoker  . Smokeless tobacco: Never Used  Vaping Use  . Vaping Use: Never used  Substance and Sexual Activity  . Alcohol use: No    Alcohol/week: 0.0 standard drinks  . Drug use: No  . Sexual activity: Yes    Birth control/protection: Post-menopausal    Comment: 1st intercourse 23 yo-1 partner  Other Topics Concern  . Not on file  Social History Narrative  . Not on file   Social Determinants of Health   Financial Resource Strain:   . Difficulty of Paying Living Expenses:   Food Insecurity:   . Worried About Programme researcher, broadcasting/film/video in the Last Year:   .  Ran Out of Food in the Last Year:   Transportation Needs:   . Freight forwarder (Medical):   Marland Kitchen Lack of Transportation (Non-Medical):   Physical Activity:   . Days of Exercise per Week:   . Minutes of Exercise per Session:   Stress:   . Feeling of Stress :   Social Connections:   . Frequency of Communication with Friends and Family:   . Frequency of Social Gatherings with Friends and Family:   . Attends Religious Services:   . Active Member of Clubs or Organizations:   . Attends Banker Meetings:   Marland Kitchen Marital Status:     Family History  Problem Relation Age of Onset  . Breast cancer Mother 2  . Hypertension Sister   . Diabetes Sister        diet & exercise controlled  . Hypertension Brother   . Heart attack Maternal Grandmother 70       Also stroke, hypertension  . Stroke Maternal Grandmother        in 18s  . Breast cancer  Maternal Aunt 75  . Hypertension Daughter   . Diabetes Father   . Heart murmur Father        two valves replaced  . Prostate cancer Father     Review of Systems  Constitutional: Negative for chills and fever.  Eyes: Negative for visual disturbance.  Respiratory: Negative for cough, shortness of breath and wheezing.   Cardiovascular: Negative for chest pain, palpitations and leg swelling.  Gastrointestinal: Negative for abdominal pain, blood in stool, constipation, diarrhea and nausea.       No gerd  Genitourinary: Negative for dysuria and hematuria.  Musculoskeletal: Positive for arthralgias (right shoulder -  - PT).  Skin: Negative for rash.  Neurological: Positive for light-headedness. Negative for headaches.  Psychiatric/Behavioral: Negative for dysphoric mood. The patient is not nervous/anxious.        Objective:   Vitals:   10/05/19 1134  BP: 122/78  Pulse: 60  Temp: 98 F (36.7 C)  SpO2: 99%   Filed Weights   10/05/19 1134  Weight: 196 lb (88.9 kg)   Body mass index is 31.64 kg/m.  BP Readings from Last 3 Encounters:  10/05/19 122/78  02/12/19 120/70  10/01/18 116/72    Wt Readings from Last 3 Encounters:  10/05/19 196 lb (88.9 kg)  02/12/19 198 lb (89.8 kg)  10/01/18 189 lb (85.7 kg)     Physical Exam Constitutional: She appears well-developed and well-nourished. No distress.  HENT:  Head: Normocephalic and atraumatic.  Right Ear: External ear normal. Normal ear canal and TM Left Ear: External ear normal.  Normal ear canal and TM Mouth/Throat: Oropharynx is clear and moist.  Eyes: Conjunctivae and EOM are normal.  Neck: Neck supple. No tracheal deviation present. No thyromegaly present.  No carotid bruit  Cardiovascular: Normal rate, regular rhythm and normal heart sounds.   No murmur heard.  No edema. Pulmonary/Chest: Effort normal and breath sounds normal. No respiratory distress. She has no wheezes. She has no rales.  Breast: deferred     Abdominal: Soft. She exhibits no distension. There is no tenderness.  Lymphadenopathy: She has no cervical adenopathy.  Skin: Skin is warm and dry. She is not diaphoretic.  Psychiatric: She has a normal mood and affect. Her behavior is normal.        Assessment & Plan:   Physical exam: Screening blood work    ordered Immunizations  had Covid,  Others up to date Colonoscopy  Due -  Scheduled  Mammogram  Up to date  Gyn  Up to date  Dexa  Up to date  Eye exams  Up to date  Exercise  Walking several times a week Weight  Working on weight loss- discussed at length Substance abuse    none  See Problem List for Assessment and Plan of chronic medical problems.   This visit occurred during the SARS-CoV-2 public health emergency.  Safety protocols were in place, including screening questions prior to the visit, additional usage of staff PPE, and extensive cleaning of exam room while observing appropriate contact time as indicated for disinfecting solutions.

## 2019-10-04 NOTE — Patient Instructions (Addendum)
Blood work was ordered.    All other Health Maintenance issues reviewed.   All recommended immunizations and age-appropriate screenings are up-to-date or discussed.  No immunization administered today.   Medications reviewed and updated.  Changes include :   saxenda injection  Your prescription(s) have been submitted to your pharmacy. Please take as directed and contact our office if you believe you are having problem(s) with the medication(s).    Please followup in 1 year     Health Maintenance, Female Adopting a healthy lifestyle and getting preventive care are important in promoting health and wellness. Ask your health care provider about:  The right schedule for you to have regular tests and exams.  Things you can do on your own to prevent diseases and keep yourself healthy. What should I know about diet, weight, and exercise? Eat a healthy diet   Eat a diet that includes plenty of vegetables, fruits, low-fat dairy products, and lean protein.  Do not eat a lot of foods that are high in solid fats, added sugars, or sodium. Maintain a healthy weight Body mass index (BMI) is used to identify weight problems. It estimates body fat based on height and weight. Your health care provider can help determine your BMI and help you achieve or maintain a healthy weight. Get regular exercise Get regular exercise. This is one of the most important things you can do for your health. Most adults should:  Exercise for at least 150 minutes each week. The exercise should increase your heart rate and make you sweat (moderate-intensity exercise).  Do strengthening exercises at least twice a week. This is in addition to the moderate-intensity exercise.  Spend less time sitting. Even light physical activity can be beneficial. Watch cholesterol and blood lipids Have your blood tested for lipids and cholesterol at 62 years of age, then have this test every 5 years. Have your cholesterol levels  checked more often if:  Your lipid or cholesterol levels are high.  You are older than 62 years of age.  You are at high risk for heart disease. What should I know about cancer screening? Depending on your health history and family history, you may need to have cancer screening at various ages. This may include screening for:  Breast cancer.  Cervical cancer.  Colorectal cancer.  Skin cancer.  Lung cancer. What should I know about heart disease, diabetes, and high blood pressure? Blood pressure and heart disease  High blood pressure causes heart disease and increases the risk of stroke. This is more likely to develop in people who have high blood pressure readings, are of African descent, or are overweight.  Have your blood pressure checked: ? Every 3-5 years if you are 33-74 years of age. ? Every year if you are 3 years old or older. Diabetes Have regular diabetes screenings. This checks your fasting blood sugar level. Have the screening done:  Once every three years after age 22 if you are at a normal weight and have a low risk for diabetes.  More often and at a younger age if you are overweight or have a high risk for diabetes. What should I know about preventing infection? Hepatitis B If you have a higher risk for hepatitis B, you should be screened for this virus. Talk with your health care provider to find out if you are at risk for hepatitis B infection. Hepatitis C Testing is recommended for:  Everyone born from [birth year] through 1963.  Anyone with known risk factors  for hepatitis C. Sexually transmitted infections (STIs)  Get screened for STIs, including gonorrhea and chlamydia, if: ? You are sexually active and are younger than 62 years of age. ? You are older than 62 years of age and your health care provider tells you that you are at risk for this type of infection. ? Your sexual activity has changed since you were last screened, and you are at increased risk  for chlamydia or gonorrhea. Ask your health care provider if you are at risk.  Ask your health care provider about whether you are at high risk for HIV. Your health care provider may recommend a prescription medicine to help prevent HIV infection. If you choose to take medicine to prevent HIV, you should first get tested for HIV. You should then be tested every 3 months for as long as you are taking the medicine. Pregnancy  If you are about to stop having your period (premenopausal) and you may become pregnant, seek counseling before you get pregnant.  Take 400 to 800 micrograms (mcg) of folic acid every day if you become pregnant.  Ask for birth control (contraception) if you want to prevent pregnancy. Osteoporosis and menopause Osteoporosis is a disease in which the bones lose minerals and strength with aging. This can result in bone fractures. If you are 98 years old or older, or if you are at risk for osteoporosis and fractures, ask your health care provider if you should:  Be screened for bone loss.  Take a calcium or vitamin D supplement to lower your risk of fractures.  Be given hormone replacement therapy (HRT) to treat symptoms of menopause. Follow these instructions at home: Lifestyle  Do not use any products that contain nicotine or tobacco, such as cigarettes, e-cigarettes, and chewing tobacco. If you need help quitting, ask your health care provider.  Do not use street drugs.  Do not share needles.  Ask your health care provider for help if you need support or information about quitting drugs. Alcohol use  Do not drink alcohol if: ? Your health care provider tells you not to drink. ? You are pregnant, may be pregnant, or are planning to become pregnant.  If you drink alcohol: ? Limit how much you use to 0-1 drink a day. ? Limit intake if you are breastfeeding.  Be aware of how much alcohol is in your drink. In the [place] one drink equals one 12 oz bottle of beer (355  mL), one 5 oz glass of wine (148 mL), or one 1 oz glass of hard liquor (44 mL). General instructions  Schedule regular health, dental, and eye exams.  Stay current with your vaccines.  Tell your health care provider if: ? You often feel depressed. ? You have ever been abused or do not feel safe at home. Summary  Adopting a healthy lifestyle and getting preventive care are important in promoting health and wellness.  Follow your health care provider's instructions about healthy diet, exercising, and getting tested or screened for diseases.  Follow your health care provider's instructions on monitoring your cholesterol and blood pressure. This information is not intended to replace advice given to you by your health care provider. Make sure you discuss any questions you have with your health care provider. Document Revised: 03/19/2018 Document Reviewed: 03/19/2018 Elsevier Patient Education  2020 [employer].

## 2019-10-05 ENCOUNTER — Ambulatory Visit (INDEPENDENT_AMBULATORY_CARE_PROVIDER_SITE_OTHER): Admitting: Internal Medicine

## 2019-10-05 ENCOUNTER — Encounter: Payer: Self-pay | Admitting: Internal Medicine

## 2019-10-05 ENCOUNTER — Other Ambulatory Visit: Payer: Self-pay

## 2019-10-05 VITALS — BP 122/78 | HR 60 | Temp 98.0°F | Ht 66.0 in | Wt 196.0 lb

## 2019-10-05 DIAGNOSIS — R7303 Prediabetes: Secondary | ICD-10-CM

## 2019-10-05 DIAGNOSIS — M8588 Other specified disorders of bone density and structure, other site: Secondary | ICD-10-CM | POA: Diagnosis not present

## 2019-10-05 DIAGNOSIS — Z0000 Encounter for general adult medical examination without abnormal findings: Secondary | ICD-10-CM | POA: Diagnosis not present

## 2019-10-05 DIAGNOSIS — E669 Obesity, unspecified: Secondary | ICD-10-CM | POA: Insufficient documentation

## 2019-10-05 LAB — HEMOGLOBIN A1C: Hgb A1c MFr Bld: 5.9 % (ref 4.6–6.5)

## 2019-10-05 LAB — CBC WITH DIFFERENTIAL/PLATELET
Basophils Absolute: 0 10*3/uL (ref 0.0–0.1)
Basophils Relative: 0.3 % (ref 0.0–3.0)
Eosinophils Absolute: 0.1 10*3/uL (ref 0.0–0.7)
Eosinophils Relative: 0.7 % (ref 0.0–5.0)
HCT: 38.7 % (ref 36.0–46.0)
Hemoglobin: 12.7 g/dL (ref 12.0–15.0)
Lymphocytes Relative: 33.7 % (ref 12.0–46.0)
Lymphs Abs: 2.5 10*3/uL (ref 0.7–4.0)
MCHC: 32.7 g/dL (ref 30.0–36.0)
MCV: 88.8 fl (ref 78.0–100.0)
Monocytes Absolute: 0.3 10*3/uL (ref 0.1–1.0)
Monocytes Relative: 3.8 % (ref 3.0–12.0)
Neutro Abs: 4.6 10*3/uL (ref 1.4–7.7)
Neutrophils Relative %: 61.5 % (ref 43.0–77.0)
Platelets: 201 10*3/uL (ref 150.0–400.0)
RBC: 4.36 Mil/uL (ref 3.87–5.11)
RDW: 15.8 % — ABNORMAL HIGH (ref 11.5–15.5)
WBC: 7.5 10*3/uL (ref 4.0–10.5)

## 2019-10-05 LAB — COMPREHENSIVE METABOLIC PANEL
ALT: 12 U/L (ref 0–35)
AST: 14 U/L (ref 0–37)
Albumin: 4 g/dL (ref 3.5–5.2)
Alkaline Phosphatase: 72 U/L (ref 39–117)
BUN: 16 mg/dL (ref 6–23)
CO2: 31 mEq/L (ref 19–32)
Calcium: 9.5 mg/dL (ref 8.4–10.5)
Chloride: 104 mEq/L (ref 96–112)
Creatinine, Ser: 0.98 mg/dL (ref 0.40–1.20)
GFR: 69.65 mL/min (ref >60.00)
Glucose, Bld: 92 mg/dL (ref 70–99)
Potassium: 4 mEq/L (ref 3.5–5.1)
Sodium: 142 mEq/L (ref 135–145)
Total Bilirubin: 0.5 mg/dL (ref 0.2–1.2)
Total Protein: 7.2 g/dL (ref 6.0–8.3)

## 2019-10-05 LAB — LIPID PANEL
Cholesterol: 171 mg/dL (ref 0–200)
HDL: 85.9 mg/dL (ref >39.00)
LDL Cholesterol: 77 mg/dL (ref 0–99)
NonHDL: 85.53
Total CHOL/HDL Ratio: 2
Triglycerides: 43 mg/dL (ref 0.0–149.0)
VLDL: 8.6 mg/dL (ref 0.0–40.0)

## 2019-10-05 LAB — TSH: TSH: 0.66 u[IU]/mL (ref 0.35–4.50)

## 2019-10-05 MED ORDER — SAXENDA 18 MG/3ML ~~LOC~~ SOPN: PEN_INJECTOR | SUBCUTANEOUS | 5 refills | Status: DC

## 2019-10-05 NOTE — Assessment & Plan Note (Signed)
Chronic Check a1c Low sugar / carb diet Stressed regular exercise  

## 2019-10-05 NOTE — Assessment & Plan Note (Signed)
Chronic She is walking regularly-walks several miles several times a week She feels she is eating healthy and not eating too much.  Discussed doing a trial of calorie counting for a couple of days She is interested in medication to help with her weight and would prefer an injectable-we will see if Bernie Covey is covered Discussed that we can consider Metformin

## 2019-10-05 NOTE — Assessment & Plan Note (Signed)
Chronic DEXA up-to-date Walking a very good amount Take a multivitamin daily

## 2019-10-22 ENCOUNTER — Telehealth: Payer: Self-pay

## 2019-10-22 NOTE — Telephone Encounter (Signed)
Started PA on line today with Cover My Meds.  Key: Anitra Lauth

## 2019-10-22 NOTE — Telephone Encounter (Signed)
New message    Prior authorization on new medication Liraglutide -Weight Management (SAXENDA) 18 MG/3ML SOPN  Last office visit 6.28.21  [id number] - [place], [place] - [address] AT [address]

## 2019-11-12 ENCOUNTER — Ambulatory Visit (AMBULATORY_SURGERY_CENTER): Payer: Self-pay

## 2019-11-12 ENCOUNTER — Other Ambulatory Visit: Payer: Self-pay

## 2019-11-12 VITALS — Ht 66.0 in | Wt 200.8 lb

## 2019-11-12 DIAGNOSIS — Z1211 Encounter for screening for malignant neoplasm of colon: Secondary | ICD-10-CM

## 2019-11-12 MED ORDER — NA SULFATE-K SULFATE-MG SULF 17.5-3.13-1.6 GM/177ML PO SOLN: 1.0000 | Freq: Once | ORAL | 0 refills | Status: AC

## 2019-11-12 NOTE — Progress Notes (Signed)
No egg or soy allergy known to patient  No issues with past sedation with any surgeries or procedures No intubation problems in the past  No FH of Malignant Hyperthermia No diet pills per patient No home 02 use per patient  No blood thinners per patient  Pt denies issues with constipation  No A fib or A flutter  EMMI video via MyChart  COVID 19 guidelines implemented in PV today with Pt and RN  Coupon given to pt in PV today , Code to Pharmacy  COVID vaccines completed on 07/2019 per pt;  Due to the COVID-19 pandemic we are asking patients to follow these guidelines. Please only bring one care partner. Please be aware that your care partner may wait in the car in the parking lot or if they feel like they will be too hot to wait in the car, they may wait in the lobby on the 4th floor. All care partners are required to wear a mask the entire time (we do not have any that we can provide them), they need to practice social distancing, and we will do a Covid check for all patient's and care partners when you arrive. Also we will check their temperature and your temperature. If the care partner waits in their car they need to stay in the parking lot the entire time and we will call them on their cell phone when the patient is ready for discharge so they can bring the car to the front of the building. Also all patient's will need to wear a mask into building.  

## 2019-11-26 ENCOUNTER — Encounter: Payer: Self-pay | Admitting: Gastroenterology

## 2019-11-26 ENCOUNTER — Other Ambulatory Visit: Payer: Self-pay

## 2019-11-26 ENCOUNTER — Ambulatory Visit (AMBULATORY_SURGERY_CENTER): Admitting: Gastroenterology

## 2019-11-26 VITALS — BP 121/63 | HR 54 | Temp 97.9°F | Resp 13 | Ht 66.0 in | Wt 200.8 lb

## 2019-11-26 DIAGNOSIS — Z1211 Encounter for screening for malignant neoplasm of colon: Secondary | ICD-10-CM | POA: Diagnosis not present

## 2019-11-26 MED ORDER — SODIUM CHLORIDE 0.9 % IV SOLN: 500.0000 mL | Freq: Once | INTRAVENOUS | Status: DC

## 2019-11-26 NOTE — Patient Instructions (Signed)
YOU HAD AN ENDOSCOPIC PROCEDURE TODAY AT THE  ENDOSCOPY CENTER:   Refer to the procedure report that was given to you for any specific questions about what was found during the examination.  If the procedure report does not answer your questions, please call your gastroenterologist to clarify.  If you requested that your care partner not be given the details of your procedure findings, then the procedure report has been included in a sealed envelope for you to review at your convenience later. ° °YOU SHOULD EXPECT: Some feelings of bloating in the abdomen. Passage of more gas than usual.  Walking can help get rid of the air that was put into your GI tract during the procedure and reduce the bloating. If you had a lower endoscopy (such as a colonoscopy or flexible sigmoidoscopy) you may notice spotting of blood in your stool or on the toilet paper. If you underwent a bowel prep for your procedure, you may not have a normal bowel movement for a few days. ° °Please Note:  You might notice some irritation and congestion in your nose or some drainage.  This is from the oxygen used during your procedure.  There is no need for concern and it should clear up in a day or so. ° °SYMPTOMS TO REPORT IMMEDIATELY: ° °Following lower endoscopy (colonoscopy or flexible sigmoidoscopy): ° Excessive amounts of blood in the stool ° Significant tenderness or worsening of abdominal pains ° Swelling of the abdomen that is new, acute ° Fever of 100°F or higher ° °For urgent or emergent issues, a gastroenterologist can be reached at any hour by calling [phone]. °Do not use MyChart messaging for urgent concerns.  ° ° °DIET:  We do recommend a small meal at first, but then you may proceed to your regular diet.  Drink plenty of fluids but you should avoid alcoholic beverages for 24 hours. ° °ACTIVITY:  You should plan to take it easy for the rest of today and you should NOT DRIVE or use heavy machinery until tomorrow (because of  the sedation medicines used during the test).   ° °FOLLOW UP: °Our staff will call the number listed on your records 48-72 hours following your procedure to check on you and address any questions or concerns that you may have regarding the information given to you following your procedure. If we do not reach you, we will leave a message.  We will attempt to reach you two times.  During this call, we will ask if you have developed any symptoms of COVID 19. If you develop any symptoms (ie: fever, flu-like symptoms, shortness of breath, cough etc.) before then, please call [phone].  If you test positive for Covid 19 in the 2 weeks post procedure, please call and report this information to us.   ° °SIGNATURES/CONFIDENTIALITY: °You and/or your care partner have signed paperwork which will be entered into your electronic medical record.  These signatures attest to the fact that that the information above on your After Visit Summary has been reviewed and is understood.  Full responsibility of the confidentiality of this discharge information lies with you and/or your care-partner.  °

## 2019-11-26 NOTE — Progress Notes (Signed)
Pt's states no medical or surgical changes since previsit or office visit. 

## 2019-11-26 NOTE — Progress Notes (Signed)
VS taken by C.W. 

## 2019-11-26 NOTE — Progress Notes (Signed)
PT taken to PACU. Monitors in place. VSS. Report given to RN. 

## 2019-11-26 NOTE — Op Note (Signed)
[facility] Patient Name: Sylvia Mcintosh Procedure Date: 11/26/2019 7:52 AM MRN: [id number] Endoscopist: Meryl Dare , MD Age: 64 Referring MD:  Date of Birth: 01-04-1956 Gender: Female Account #: [id number] Procedure:                Colonoscopy Indications:              Screening for colorectal malignant neoplasm Medicines:                Monitored Anesthesia Care Procedure:                Pre-Anesthesia Assessment:                           - Prior to the procedure, a History and Physical                            was performed, and patient medications and                            allergies were reviewed. The patient's tolerance of                            previous anesthesia was also reviewed. The risks                            and benefits of the procedure and the sedation                            options and risks were discussed with the patient.                            All questions were answered, and informed consent                            was obtained. Prior Anticoagulants: The patient has                            taken no previous anticoagulant or antiplatelet                            agents. ASA Grade Assessment: II - A patient with                            mild systemic disease. After reviewing the risks                            and benefits, the patient was deemed in                            satisfactory condition to undergo the procedure.                           After obtaining informed consent, the colonoscope  was passed under direct vision. Throughout the                            procedure, the patient's blood pressure, pulse, and                            oxygen saturations were monitored continuously. The                            Colonoscope was introduced through the anus and                            advanced to the the cecum, identified by                            appendiceal orifice and  ileocecal valve. The                            ileocecal valve, appendiceal orifice, and rectum                            were photographed. The quality of the bowel                            preparation was excellent. The colonoscopy was                            performed without difficulty. The patient tolerated                            the procedure well. Scope In: 8:06:34 AM Scope Out: 8:18:24 AM Scope Withdrawal Time: 0 hours 9 minutes 13 seconds  Total Procedure Duration: 0 hours 11 minutes 50 seconds  Findings:                 The perianal and digital rectal examinations were                            normal.                           The entire examined colon appeared normal on direct                            and retroflexion views. Complications:            No immediate complications. Estimated blood loss:                            None. Estimated Blood Loss:     Estimated blood loss: none. Impression:               - The entire examined colon is normal on direct and                            retroflexion views.                           -  No specimens collected. Recommendation:           - Repeat colonoscopy in 10 years for screening                            purposes.                           - Patient has a contact number available for                            emergencies. The signs and symptoms of potential                            delayed complications were discussed with the                            patient. Return to normal activities tomorrow.                            Written discharge instructions were provided to the                            patient.                           - Resume previous diet.                           - Continue present medications. Meryl Dare, MD 11/26/2019 8:20:27 AM This report has been signed electronically.

## 2019-11-30 ENCOUNTER — Telehealth: Payer: Self-pay

## 2019-11-30 NOTE — Telephone Encounter (Signed)
°  Follow up Call-  Call back number 11/26/2019  Post procedure Call Back phone  # [phone]  Permission to leave phone message Yes  Some recent data might be hidden     Patient questions:  Do you have a fever, pain , or abdominal swelling? No. Pain Score  0 *  Have you tolerated food without any problems? Yes.    Have you been able to return to your normal activities? Yes.    Do you have any questions about your discharge instructions: Diet   No. Medications  No. Follow up visit  No.  Do you have questions or concerns about your Care? No.  Actions: * If pain score is 4 or above: No action needed, pain <4.  1. Have you developed a fever since your procedure? no  2.   Have you had an respiratory symptoms (SOB or cough) since your procedure? no  3.   Have you tested positive for COVID 19 since your procedure no  4.   Have you had any family members/close contacts diagnosed with the COVID 19 since your procedure?  no   If yes to any of these questions please route to Laverna Peace, RN and Karlton Lemon, RN

## 2020-02-16 ENCOUNTER — Encounter: Admitting: Obstetrics and Gynecology

## 2020-02-16 ENCOUNTER — Encounter: Admitting: Gynecology

## 2020-02-23 ENCOUNTER — Encounter: Admitting: Obstetrics and Gynecology

## 2020-04-11 ENCOUNTER — Encounter: Admitting: Obstetrics and Gynecology

## 2020-04-13 ENCOUNTER — Other Ambulatory Visit: Payer: Self-pay

## 2020-04-13 ENCOUNTER — Ambulatory Visit (INDEPENDENT_AMBULATORY_CARE_PROVIDER_SITE_OTHER): Admitting: Obstetrics and Gynecology

## 2020-04-13 ENCOUNTER — Encounter: Payer: Self-pay | Admitting: Obstetrics and Gynecology

## 2020-04-13 VITALS — BP 124/80 | Ht 66.0 in | Wt 197.0 lb

## 2020-04-13 DIAGNOSIS — Z01419 Encounter for gynecological examination (general) (routine) without abnormal findings: Secondary | ICD-10-CM

## 2020-04-13 DIAGNOSIS — M8580 Other specified disorders of bone density and structure, unspecified site: Secondary | ICD-10-CM

## 2020-04-13 NOTE — Progress Notes (Signed)
   Sylvia Mcintosh 01-Jul-1955 [id number]  SUBJECTIVE:  65 y.o. [id number] female here for a breast and pelvic exam. She has no gynecologic concerns.  Current Outpatient Medications  Medication Sig Dispense Refill  . Multiple Vitamins-Minerals (WOMENS MULTIVITAMIN PO) Take by mouth daily.     No current facility-administered medications for this visit.   Allergies: Penicillin g and Citalopram  No LMP recorded. Patient is postmenopausal.  Past medical history,surgical history, problem list, medications, allergies, family history and social history were all reviewed and documented as reviewed in the EPIC chart.  GYN ROS: no abnormal bleeding, pelvic pain or discharge, no breast pain or new or enlarging lumps on self exam.  No dysuria, urinary frequency, pain with urination, cloudy/malodorous urine.   OBJECTIVE:  BP 124/80 (BP Location: Right Arm, Patient Position: Sitting, Cuff Size: Normal)   Ht 5\' 6"  (1.676 m)   Wt 197 lb (89.4 kg)   BMI 31.80 kg/m  The patient appears well, alert, oriented, in no distress.  BREAST EXAM: breasts appear normal, no suspicious masses, no skin or nipple changes or axillary nodes  PELVIC EXAM: VULVA: normal appearing vulva with atrophic change, no masses, tenderness or lesions, VAGINA: normal appearing vagina with atrophic change, normal color and discharge, no lesions, CERVIX: normal appearing atrophic cervix without discharge or lesions, UTERUS: uterus is normal size, shape, consistency and nontender, ADNEXA: normal adnexa in size, nontender and no masses  Chaperone: Bonham present during the examination  ASSESSMENT:  65 y.o. 70 here for a breast and pelvic exam  PLAN:   1. Postmenopausal.  No significant hot flashes or night sweats.  No vaginal bleeding. 2. Pap smear/HPV 2019.  No significant history of abnormal Pap smears.  Next Pap smear due 2024 following the current guidelines recommending the 5 year interval. 3. Mammogram  06/2019.  Normal breast exam today.  Continue with annual mammograms. 4. Colonoscopy 11/2019.  She will follow up at the interval recommended by her GI specialist.   5. Osteopenia.  DEXA 2019 T score stable from prior DEXA.  Discussed vitamin D and calcium in diet.  Next DEXA recommended this year so she plans to schedule. 6. Health maintenance.  No labs today as she normally has these completed elsewhere.  Return annually or sooner, prn.  2020 MD 04/13/20

## 2020-05-17 ENCOUNTER — Encounter

## 2020-06-07 ENCOUNTER — Other Ambulatory Visit: Payer: Self-pay | Admitting: Internal Medicine

## 2020-08-23 ENCOUNTER — Encounter

## 2020-08-24 ENCOUNTER — Other Ambulatory Visit: Payer: Self-pay | Admitting: Obstetrics and Gynecology

## 2020-08-24 ENCOUNTER — Ambulatory Visit (INDEPENDENT_AMBULATORY_CARE_PROVIDER_SITE_OTHER)

## 2020-08-24 ENCOUNTER — Other Ambulatory Visit: Payer: Self-pay

## 2020-08-24 DIAGNOSIS — M8588 Other specified disorders of bone density and structure, other site: Secondary | ICD-10-CM

## 2020-08-24 DIAGNOSIS — Z780 Asymptomatic menopausal state: Secondary | ICD-10-CM

## 2020-08-24 DIAGNOSIS — M8580 Other specified disorders of bone density and structure, unspecified site: Secondary | ICD-10-CM

## 2020-08-24 DIAGNOSIS — Z01419 Encounter for gynecological examination (general) (routine) without abnormal findings: Secondary | ICD-10-CM

## 2020-08-25 ENCOUNTER — Other Ambulatory Visit: Payer: Self-pay | Admitting: Internal Medicine

## 2020-08-25 DIAGNOSIS — Z1231 Encounter for screening mammogram for malignant neoplasm of breast: Secondary | ICD-10-CM

## 2020-10-02 DIAGNOSIS — H43812 Vitreous degeneration, left eye: Secondary | ICD-10-CM | POA: Insufficient documentation

## 2020-10-02 NOTE — Progress Notes (Signed)
Subjective:    Patient ID: Sylvia Mcintosh, female    DOB: [birth year], 65 y.o.   MRN: [id number]  HPI The patient is here for follow up from the ED  Went to ED for floaters associated with a headache that started in left eye around 1300.  She had increased pressure with blinking but no pain.  She was dx with posterior vitreous detachment and advised f/u with Dr Valere Dross.  She still has floaters - they come and go.   She has a slight headache, which is mild.  She has called Dr Markus Daft office but has not heard from them yet.     Not losing weight - She is walking 5 days a week 4-6 miles each time.  She eats healthy.  She has bad habits - she has cut back on candy.  She is having a hard time cutting back on junk food.      Medications and allergies reviewed with patient and updated if appropriate.  Patient Active Problem List   Diagnosis Date Noted   Posterior vitreous detachment of left eye 10/02/2020   Obesity (BMI 30.0-34.9) 10/05/2019   Acute pain of right shoulder 05/02/2018   Prediabetes 06/26/2017   Family history of diabetes mellitus (DM) 06/25/2017   Nonspecific abnormal electrocardiogram (ECG) (EKG) 11/18/2013   Osteopenia    Seasonal allergies 04/22/2009    Current Outpatient Medications on File Prior to Visit  Medication Sig Dispense Refill   Multiple Vitamins-Minerals (WOMENS MULTIVITAMIN PO) Take by mouth daily.     No current facility-administered medications on file prior to visit.    Past Medical History:  Diagnosis Date   Allergy    Seasonal rhinoconjunctivitis with voice loss   Cataract 2020   Left eye sx   Depression 2012   work stress related   Mitral valve prolapse 1984   2 D ECHO   Nonspecific abnormal electrocardiogram (ECG) (EKG) 2004   Negative nuclear stress test   Osteopenia 10/2014   T score -1.5 FRAX 2.5%/0.1%. No significant change from prior DEXA.   Vitamin D deficiency     Past Surgical History:  Procedure Laterality  Date   BUNIONECTOMY     cataract surg  2020   COLONOSCOPY  2012   Negative; [place]   ECTOPIC PREGNANCY SURGERY     HAMMER TOE SURGERY     Bilateral   MOUTH SURGERY     Implants   TUBAL LIGATION     Tubal Reversal      Social History   Socioeconomic History   Marital status: Married    Spouse name: Not on file   Number of children: Not on file   Years of education: Not on file   Highest education level: Not on file  Occupational History   Not on file  Tobacco Use   Smoking status: Never   Smokeless tobacco: Never  Vaping Use   Vaping Use: Never used  Substance and Sexual Activity   Alcohol use: No    Alcohol/week: 0.0 standard drinks   Drug use: No   Sexual activity: Yes    Birth control/protection: Post-menopausal    Comment: 1st intercourse 53 yo-1 partner  Other Topics Concern   Not on file  Social History Narrative   Not on file   Social Determinants of Health   Financial Resource Strain: Not on file  Food Insecurity: Not on file  Transportation Needs: Not on file  Physical Activity: Not on file  Stress:  Not on file  Social Connections: Not on file    Family History  Problem Relation Age of Onset   Breast cancer Mother 63   Hypertension Sister    Diabetes Sister        diet & exercise controlled   Hypertension Brother    Heart attack Maternal Grandmother 71       Also stroke, hypertension   Stroke Maternal Grandmother        in 38s   Breast cancer Maternal Aunt 12   Hypertension Daughter    Diabetes Father    Heart murmur Father        two valves replaced   Prostate cancer Father     Review of Systems     Objective:   Vitals:   10/03/20 0921  BP: 128/80  Pulse: 65  Temp: 98 F (36.7 C)  SpO2: 98%   BP Readings from Last 3 Encounters:  10/03/20 128/80  04/13/20 124/80  11/26/19 121/63   Wt Readings from Last 3 Encounters:  10/03/20 196 lb (88.9 kg)  04/13/20 197 lb (89.4 kg)  11/26/19 200 lb 12.8 oz (91.1 kg)   Body mass  index is 31.64 kg/m.   Physical Exam       Assessment & Plan:    See Problem List for Assessment and Plan of chronic medical problems.    This visit occurred during the SARS-CoV-2 public health emergency.  Safety protocols were in place, including screening questions prior to the visit, additional usage of staff PPE, and extensive cleaning of exam room while observing appropriate contact time as indicated for disinfecting solutions.

## 2020-10-03 ENCOUNTER — Other Ambulatory Visit: Payer: Self-pay

## 2020-10-03 ENCOUNTER — Encounter: Payer: Self-pay | Admitting: Internal Medicine

## 2020-10-03 ENCOUNTER — Ambulatory Visit: Admitting: Internal Medicine

## 2020-10-03 VITALS — BP 128/80 | HR 65 | Temp 98.0°F | Ht 66.0 in | Wt 196.0 lb

## 2020-10-03 DIAGNOSIS — H43812 Vitreous degeneration, left eye: Secondary | ICD-10-CM

## 2020-10-03 DIAGNOSIS — E669 Obesity, unspecified: Secondary | ICD-10-CM

## 2020-10-03 DIAGNOSIS — I341 Nonrheumatic mitral (valve) prolapse: Secondary | ICD-10-CM | POA: Insufficient documentation

## 2020-10-03 NOTE — Assessment & Plan Note (Signed)
Seen in ED Will follow up dr Tomi Bamberger

## 2020-10-03 NOTE — Assessment & Plan Note (Signed)
Chronic She is frustrated by her inability to lose weight She is walking 5 days a week 4 miles each time-recently increase this to 6 miles She states she eats fairly healthy, but she does admit to bad habits as far as eating candy which she has cut back on She also is trying to cut down on junk food but she knows she needs too much of that She wonders about a weight loss medication.  She has about a tummy tuck, gastric bypass and other options  Discussed that she is exercising a good amount and should continue her regular exercise-increasing this unlikely is going to help with her weight loss Discussed that the only way she is going to lose weight is to eat less-eliminate junk food and continue to avoid candy Discussed some weight loss medications.  Discussed that most likely these will not help as long as she continues to eat like she is, but if she really wants to try one we could.  There are risks with several of these and most are likely not covered She is not a candidate for gastric sleeve Discussed the option of a tummy tuck-this will be out-of-pocket

## 2020-10-03 NOTE — Patient Instructions (Signed)
   Follow up with Dr Valere Dross.

## 2020-10-14 ENCOUNTER — Encounter: Admitting: Internal Medicine

## 2020-10-18 NOTE — Progress Notes (Signed)
Subjective:    Patient ID: Sylvia Mcintosh, female    DOB: May 07, 1955, 65 y.o.   MRN: [id number]   This visit occurred during the SARS-CoV-2 public health emergency.  Safety protocols were in place, including screening questions prior to the visit, additional usage of staff PPE, and extensive cleaning of exam room while observing appropriate contact time as indicated for disinfecting solutions.    HPI She is here for a physical exam.     Medications and allergies reviewed with patient and updated if appropriate.  Patient Active Problem List   Diagnosis Date Noted  . Mitral valve prolapse 10/03/2020  . Posterior vitreous detachment of left eye 10/02/2020  . Obesity (BMI 30.0-34.9) 10/05/2019  . Acute pain of right shoulder 05/02/2018  . Prediabetes 06/26/2017  . Family history of diabetes mellitus (DM) 06/25/2017  . Nonspecific abnormal electrocardiogram (ECG) (EKG) 11/18/2013  . Osteopenia   . Seasonal allergies 04/22/2009    Current Outpatient Medications on File Prior to Visit  Medication Sig Dispense Refill  . Multiple Vitamins-Minerals (WOMENS MULTIVITAMIN PO) Take by mouth daily.     No current facility-administered medications on file prior to visit.    Past Medical History:  Diagnosis Date  . Allergy    Seasonal rhinoconjunctivitis with voice loss  . Cataract 2020   Left eye sx  . Depression 2012   work stress related  . Mitral valve prolapse 1984   2 D ECHO  . Nonspecific abnormal electrocardiogram (ECG) (EKG) 2004   Negative nuclear stress test  . Osteopenia 10/2014   T score -1.5 FRAX 2.5%/0.1%. No significant change from prior DEXA.  Marland Kitchen Vitamin D deficiency     Past Surgical History:  Procedure Laterality Date  . BUNIONECTOMY    . cataract surg  2020  . COLONOSCOPY  2012   Negative; [place]  . ECTOPIC PREGNANCY SURGERY    . HAMMER TOE SURGERY     Bilateral  . MOUTH SURGERY     Implants  . TUBAL LIGATION    . Tubal Reversal       Social History   Socioeconomic History  . Marital status: Married    Spouse name: Not on file  . Number of children: Not on file  . Years of education: Not on file  . Highest education level: Not on file  Occupational History  . Not on file  Tobacco Use  . Smoking status: Never  . Smokeless tobacco: Never  Vaping Use  . Vaping Use: Never used  Substance and Sexual Activity  . Alcohol use: No    Alcohol/week: 0.0 standard drinks  . Drug use: No  . Sexual activity: Yes    Birth control/protection: Post-menopausal    Comment: 1st intercourse 60 yo-1 partner  Other Topics Concern  . Not on file  Social History Narrative  . Not on file   Social Determinants of Health   Financial Resource Strain: Not on file  Food Insecurity: Not on file  Transportation Needs: Not on file  Physical Activity: Not on file  Stress: Not on file  Social Connections: Not on file    Family History  Problem Relation Age of Onset  . Breast cancer Mother 62  . Hypertension Sister   . Diabetes Sister        diet & exercise controlled  . Hypertension Brother   . Heart attack Maternal Grandmother 70       Also stroke, hypertension  . Stroke Maternal Grandmother  in 16s  . Breast cancer Maternal Aunt 75  . Hypertension Daughter   . Diabetes Father   . Heart murmur Father        two valves replaced  . Prostate cancer Father     Review of Systems     Objective:  There were no vitals filed for this visit. There were no vitals filed for this visit. There is no height or weight on file to calculate BMI.  BP Readings from Last 3 Encounters:  10/03/20 128/80  04/13/20 124/80  11/26/19 121/63    Wt Readings from Last 3 Encounters:  10/03/20 196 lb (88.9 kg)  04/13/20 197 lb (89.4 kg)  11/26/19 200 lb 12.8 oz (91.1 kg)     Physical Exam Constitutional: She appears well-developed and well-nourished. No distress.  HENT:  Head: Normocephalic and atraumatic.  Right Ear:  External ear normal. Normal ear canal and TM Left Ear: External ear normal.  Normal ear canal and TM Mouth/Throat: Oropharynx is clear and moist.  Eyes: Conjunctivae and EOM are normal.  Neck: Neck supple. No tracheal deviation present. No thyromegaly present.  No carotid bruit  Cardiovascular: Normal rate, regular rhythm and normal heart sounds.   No murmur heard.  No edema. Pulmonary/Chest: Effort normal and breath sounds normal. No respiratory distress. She has no wheezes. She has no rales.  Breast: deferred   Abdominal: Soft. She exhibits no distension. There is no tenderness.  Lymphadenopathy: She has no cervical adenopathy.  Skin: Skin is warm and dry. She is not diaphoretic.  Psychiatric: She has a normal mood and affect. Her behavior is normal.        Assessment & Plan:   Physical exam: Screening blood work  ordered Exercise   Diet Weight   advised weight loss  Substance abuse  none   TDAP -  Discussed covid vaccine recommendations  Health Maintenance  Topic Date Due  . COVID-19 Vaccine (4 - Booster for Moderna series) 06/24/2020  . INFLUENZA VACCINE  11/07/2020  . MAMMOGRAM  07/07/2021  . PAP SMEAR-Modifier  02/06/2023  . DEXA SCAN  08/25/2023  . TETANUS/TDAP  04/27/2026  . COLONOSCOPY (Pts 45-66yrs Insurance coverage will need to be confirmed)  11/25/2029  . Hepatitis C Screening  Completed  . HIV Screening  Completed  . Zoster Vaccines- Shingrix  Completed  . Pneumococcal Vaccine 76-58 Years old  Aged Out  . HPV VACCINES  Aged Out          See Problem List for Assessment and Plan of chronic medical problems.      This encounter was created in error - please disregard.

## 2020-10-18 NOTE — Patient Instructions (Addendum)
  Blood work was ordered.     Medications changes include :     Your prescription(s) have been submitted to your pharmacy. Please take as directed and contact our office if you believe you are having problem(s) with the medication(s).   A referral was ordered for        Someone from their office will call you to schedule an appointment.    Please followup in 1 year  

## 2020-10-19 ENCOUNTER — Encounter: Payer: Self-pay | Admitting: Internal Medicine

## 2020-10-19 ENCOUNTER — Encounter: Admitting: Internal Medicine

## 2020-10-19 NOTE — Assessment & Plan Note (Signed)
Chronic Stressed importance of regular exercise Discussed the importance of a healthy diet, decrease portions

## 2020-10-19 NOTE — Assessment & Plan Note (Signed)
Chronic Check a1c Low sugar / carb diet Stressed regular exercise  

## 2020-10-19 NOTE — Assessment & Plan Note (Signed)
Chronic DEXA done at GYN's office Stressed the importance of regular exercise Taking a multivitamin daily Check vitamin D level

## 2020-10-24 ENCOUNTER — Other Ambulatory Visit: Payer: Self-pay

## 2020-10-24 ENCOUNTER — Ambulatory Visit
Admission: RE | Admit: 2020-10-24 | Discharge: 2020-10-24 | Disposition: A | Discharge status: Home / Self Care | Source: Ambulatory Visit | Attending: Internal Medicine | Admitting: Internal Medicine

## 2020-10-24 DIAGNOSIS — Z1231 Encounter for screening mammogram for malignant neoplasm of breast: Secondary | ICD-10-CM

## 2020-11-10 IMAGING — MG DIGITAL SCREENING BILAT W/ TOMO W/ CAD
8 series · 8 of 24 positions shown · non-contrast
Comparison: Previous exam(s).

CLINICAL DATA: Screening.

EXAM:
DIGITAL SCREENING BILATERAL MAMMOGRAM WITH TOMO AND CAD

[L MLO synth-2D]
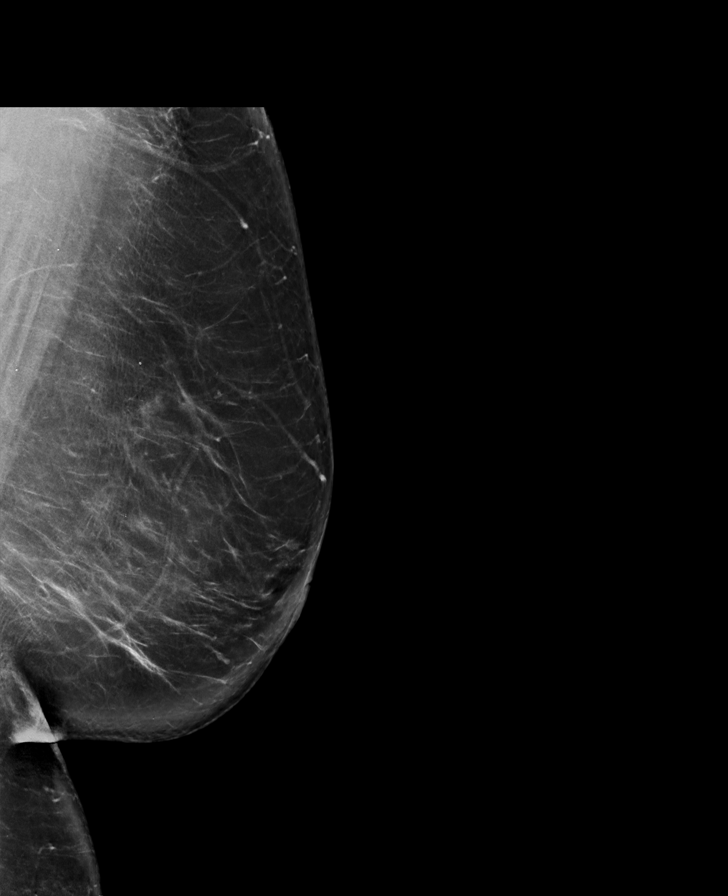

[R MLO synth-2D]
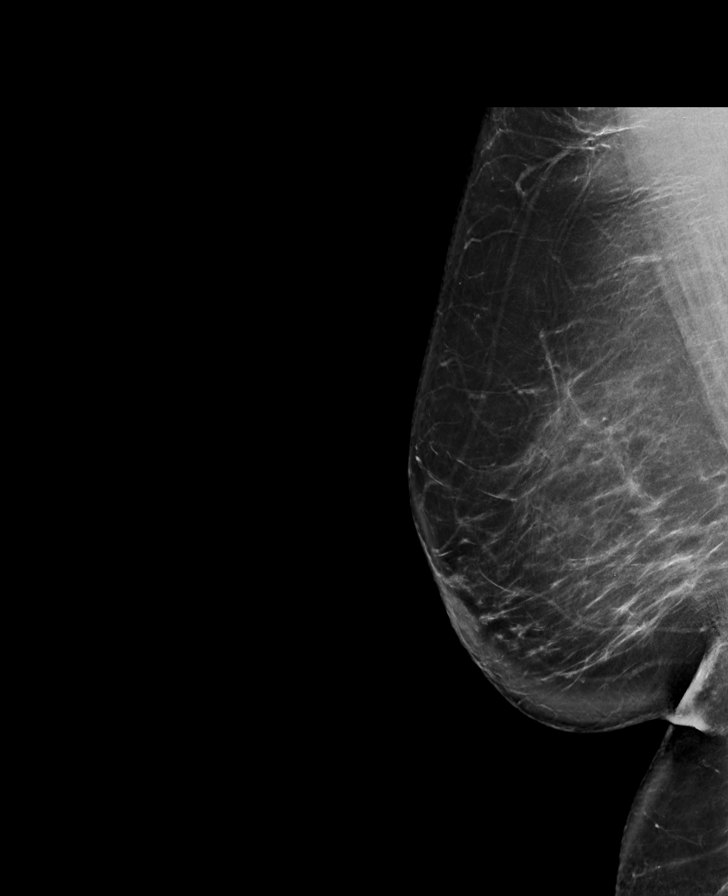

[L CC synth-2D]
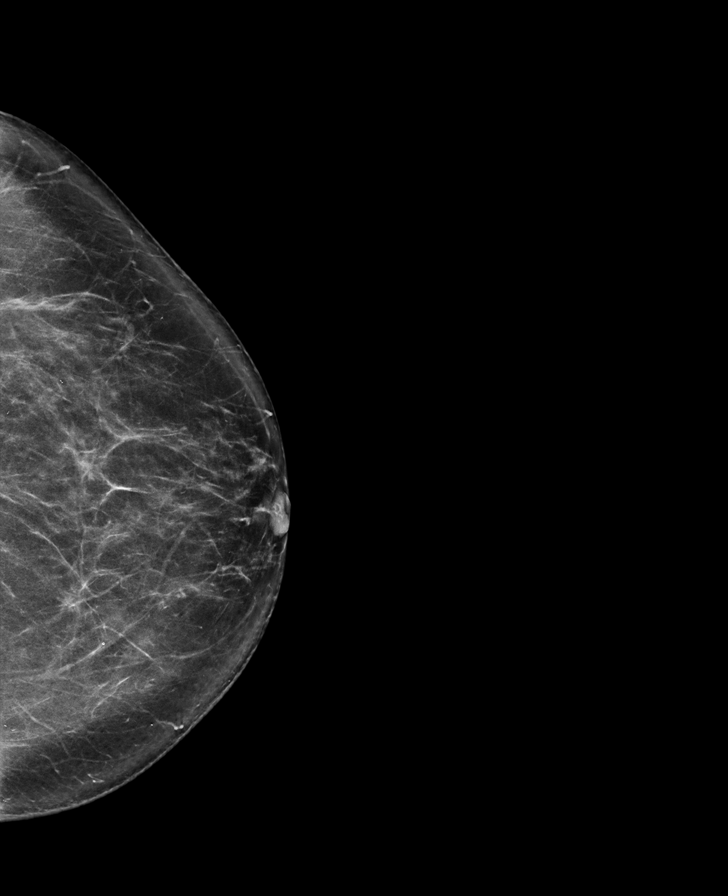

[R CC synth-2D]
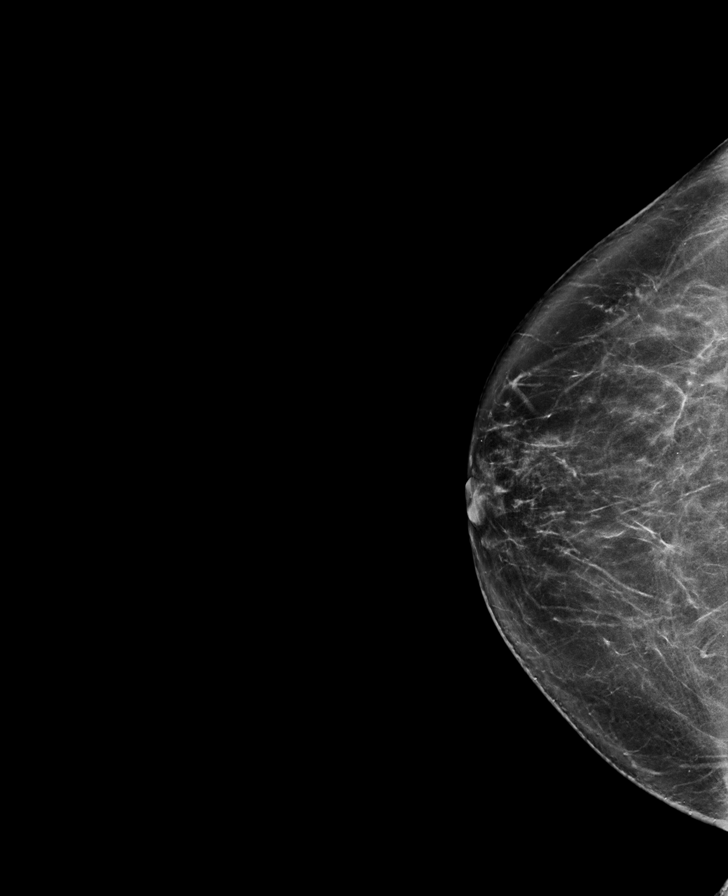

[R MLO tomo · tomo slice 51/100.0]
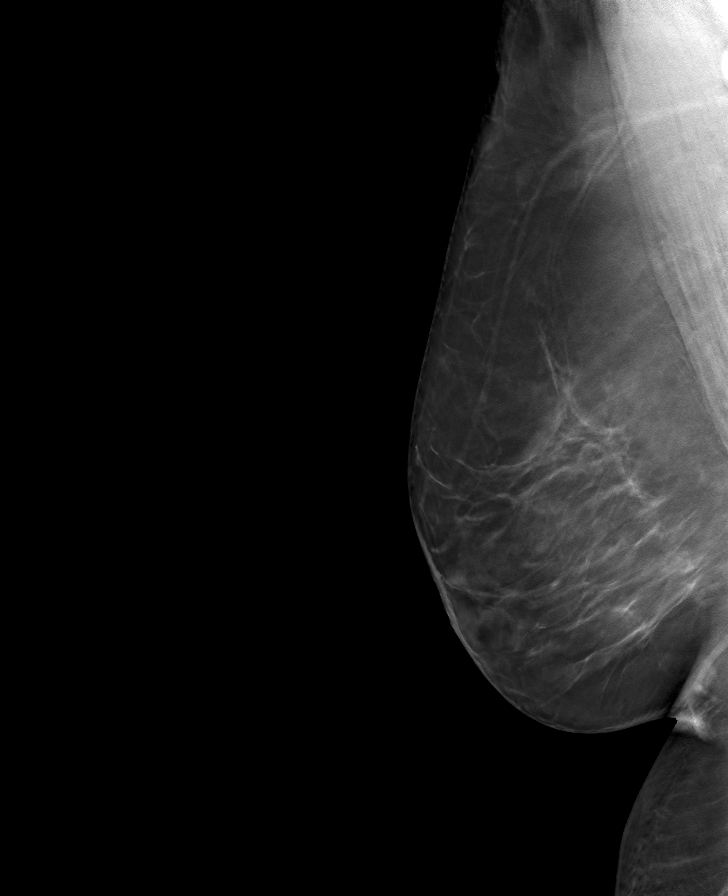

[L CC tomo · tomo slice 41/82.0]
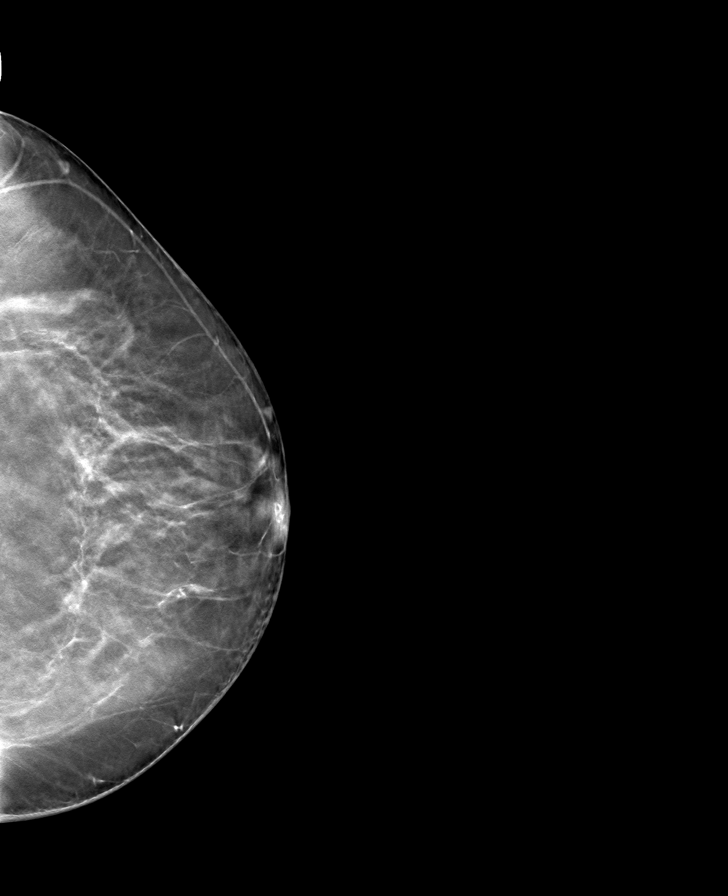

[L MLO tomo · tomo slice 51/101.0]
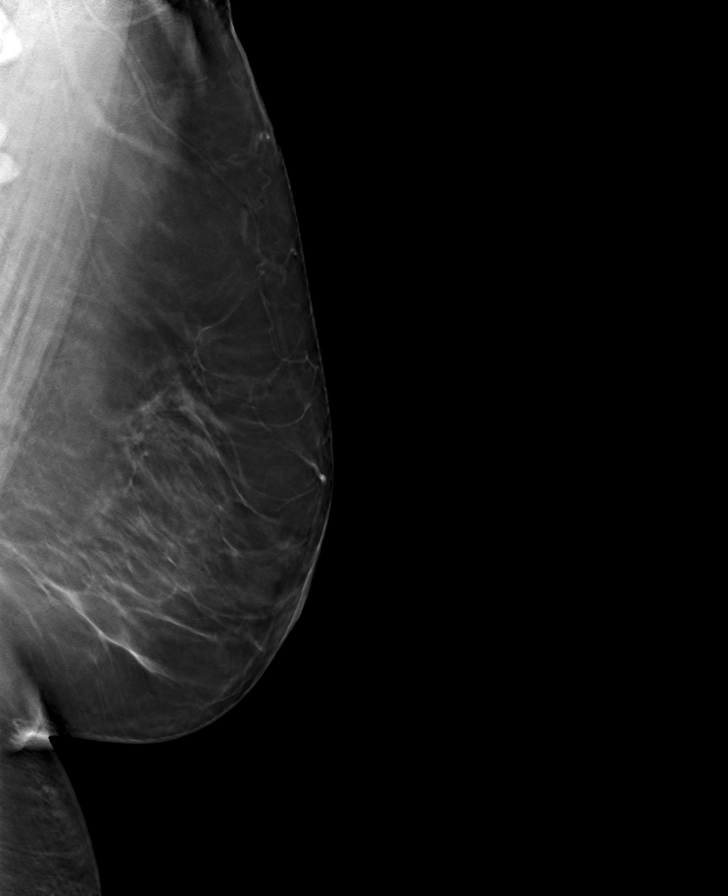

[R CC tomo · tomo slice 41/81.0]
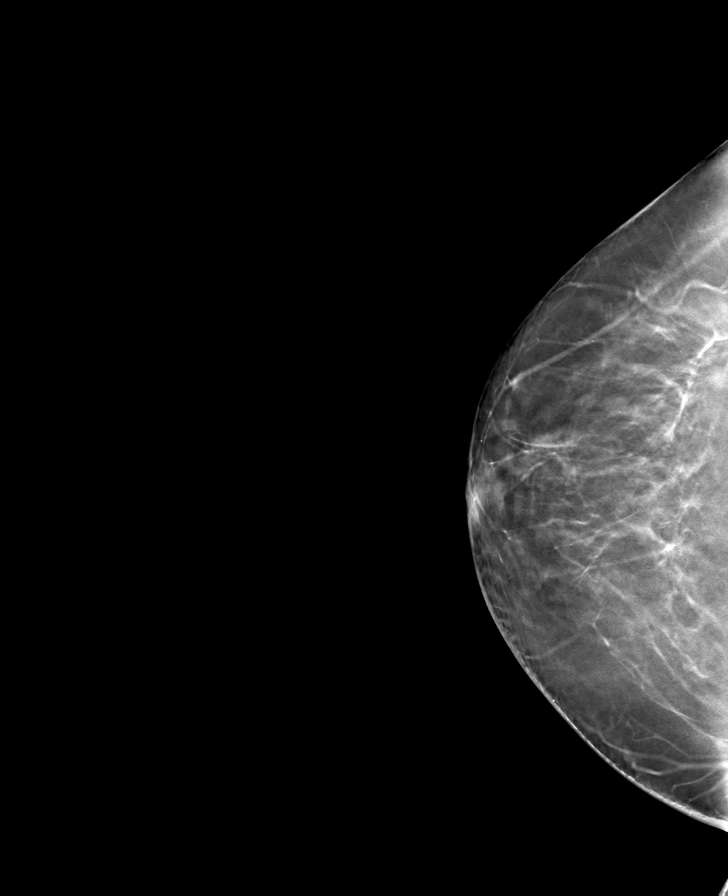

[8 of 24 positions shown; findings below may reference images not displayed]

ACR Breast Density Category b: There are scattered areas of
fibroglandular density.
FINDINGS: There are no findings suspicious for malignancy. Images were
processed with CAD.
IMPRESSION: No mammographic evidence of malignancy. A result letter of this
screening mammogram will be mailed directly to the patient.

RECOMMENDATION:
Screening mammogram in one year. (Code:[id number])

BI-RADS CATEGORY  1: Negative.

## 2020-11-10 NOTE — Progress Notes (Signed)
Subjective:    Patient ID: Sylvia Mcintosh, female    DOB: [birth year], 65 y.o.   MRN: [id number]   This visit occurred during the SARS-CoV-2 public health emergency.  Safety protocols were in place, including screening questions prior to the visit, additional usage of staff PPE, and extensive cleaning of exam room while observing appropriate contact time as indicated for disinfecting solutions.    HPI She is here for a physical exam.     Medications and allergies reviewed with patient and updated if appropriate.  Patient Active Problem List   Diagnosis Date Noted   Impacted cerumen of left ear 11/11/2020   Mitral valve prolapse 10/03/2020   Posterior vitreous detachment of left eye 10/02/2020   Obesity (BMI 30.0-34.9) 10/05/2019   Acute pain of right shoulder 05/02/2018   Prediabetes 06/26/2017   Family history of diabetes mellitus (DM) 06/25/2017   Nonspecific abnormal electrocardiogram (ECG) (EKG) 11/18/2013   Osteopenia    Seasonal allergies 04/22/2009    Current Outpatient Medications on File Prior to Visit  Medication Sig Dispense Refill   Multiple Vitamins-Minerals (WOMENS MULTIVITAMIN PO) Take by mouth daily.     No current facility-administered medications on file prior to visit.    Past Medical History:  Diagnosis Date   Allergy    Seasonal rhinoconjunctivitis with voice loss   Cataract 2020   Left eye sx   Depression 2012   work stress related   Mitral valve prolapse 1984   2 D ECHO   Nonspecific abnormal electrocardiogram (ECG) (EKG) 2004   Negative nuclear stress test   Osteopenia 10/2014   T score -1.5 FRAX 2.5%/0.1%. No significant change from prior DEXA.   Vitamin D deficiency     Past Surgical History:  Procedure Laterality Date   BUNIONECTOMY     cataract surg  2020   COLONOSCOPY  2012   Negative; [place]   ECTOPIC PREGNANCY SURGERY     HAMMER TOE SURGERY     Bilateral   MOUTH SURGERY     Implants   TUBAL LIGATION     Tubal  Reversal      Social History   Socioeconomic History   Marital status: Married    Spouse name: Not on file   Number of children: Not on file   Years of education: Not on file   Highest education level: Not on file  Occupational History   Not on file  Tobacco Use   Smoking status: Never   Smokeless tobacco: Never  Vaping Use   Vaping Use: Never used  Substance and Sexual Activity   Alcohol use: No    Alcohol/week: 0.0 standard drinks   Drug use: No   Sexual activity: Yes    Birth control/protection: Post-menopausal    Comment: 1st intercourse 14 yo-1 partner  Other Topics Concern   Not on file  Social History Narrative   Not on file   Social Determinants of Health   Financial Resource Strain: Not on file  Food Insecurity: Not on file  Transportation Needs: Not on file  Physical Activity: Not on file  Stress: Not on file  Social Connections: Not on file    Family History  Problem Relation Age of Onset   Breast cancer Mother 59   Hypertension Sister    Diabetes Sister        diet & exercise controlled   Hypertension Brother    Heart attack Maternal Grandmother 31       Also  stroke, hypertension   Stroke Maternal Grandmother        in 30s   Breast cancer Maternal Aunt 75   Hypertension Daughter    Diabetes Father    Heart murmur Father        two valves replaced   Prostate cancer Father     Review of Systems  Constitutional:  Negative for chills and fever.  Eyes:  Negative for visual disturbance.  Respiratory:  Negative for cough, shortness of breath and wheezing.   Cardiovascular:  Negative for chest pain and palpitations.  Gastrointestinal:  Negative for abdominal pain, blood in stool, constipation, diarrhea and nausea.       No gerd  Genitourinary:  Negative for dysuria and hematuria.  Musculoskeletal:  Positive for arthralgias (minimal stiffness). Negative for back pain.  Skin:  Negative for color change and rash.  Neurological:  Negative for  dizziness, light-headedness, numbness and headaches.  Psychiatric/Behavioral:  Negative for dysphoric mood and sleep disturbance. The patient is not nervous/anxious.       Objective:   Vitals:   11/11/20 1258  BP: 122/80  Pulse: 76  Temp: 97.9 F (36.6 C)  SpO2: 99%   Filed Weights   11/11/20 1258  Weight: 196 lb (88.9 kg)   Body mass index is 31.64 kg/m.  BP Readings from Last 3 Encounters:  11/11/20 122/80  10/03/20 128/80  04/13/20 124/80    Wt Readings from Last 3 Encounters:  11/11/20 196 lb (88.9 kg)  10/03/20 196 lb (88.9 kg)  04/13/20 197 lb (89.4 kg)     Physical Exam Constitutional: She appears well-developed and well-nourished. No distress.  HENT:  Head: Normocephalic and atraumatic.  Right Ear: External ear normal. Normal ear canal and TM Left Ear: External ear normal.  Ear canal with obstructing wax Mouth/Throat: Oropharynx is clear and moist.  Eyes: Conjunctivae and EOM are normal.  Neck: Neck supple. No tracheal deviation present. No thyromegaly present.  No carotid bruit  Cardiovascular: Normal rate, regular rhythm and normal heart sounds.   No murmur heard.  No edema. Pulmonary/Chest: Effort normal and breath sounds normal. No respiratory distress. She has no wheezes. She has no rales.  Breast: deferred   Abdominal: Soft. She exhibits no distension. There is no tenderness.  Lymphadenopathy: She has no cervical adenopathy.  Skin: Skin is warm and dry. She is not diaphoretic.  Psychiatric: She has a normal mood and affect. Her behavior is normal.        Assessment & Plan:   Physical exam: Screening blood work  ordered Exercise  she walks regularly 4-5 miles x 4-5 days a week Weight   advised weight loss  Substance abuse  none    Discussed covid vaccine recommendations.  Scheduled for covid booster #2.      Health Maintenance  Topic Date Due   INFLUENZA VACCINE  11/07/2020   COVID-19 Vaccine (4 - Booster for Moderna series)  11/27/2020 (Originally 06/24/2020)   MAMMOGRAM  10/25/2022   PAP SMEAR-Modifier  02/06/2023   DEXA SCAN  08/25/2023   TETANUS/TDAP  04/27/2026   COLONOSCOPY (Pts 45-55yrs Insurance coverage will need to be confirmed)  11/25/2029   Hepatitis C Screening  Completed   HIV Screening  Completed   Zoster Vaccines- Shingrix  Completed   HPV VACCINES  Aged Out   Pneumococcal Vaccine 75-50 Years old  Discontinued          See Problem List for Assessment and Plan of chronic medical problems.

## 2020-11-10 NOTE — Patient Instructions (Addendum)
Blood work was ordered.     Medications changes include :   none     Please followup in 1 year    Health Maintenance, Female Adopting a healthy lifestyle and getting preventive care are important in promoting health and wellness. Ask your health care provider about: The right schedule for you to have regular tests and exams. Things you can do on your own to prevent diseases and keep yourself healthy. What should I know about diet, weight, and exercise? Eat a healthy diet  Eat a diet that includes plenty of vegetables, fruits, low-fat dairy products, and lean protein. Do not eat a lot of foods that are high in solid fats, added sugars, or sodium.  Maintain a healthy weight Body mass index (BMI) is used to identify weight problems. It estimates body fat based on height and weight. Your health care provider can help determineyour BMI and help you achieve or maintain a healthy weight. Get regular exercise Get regular exercise. This is one of the most important things you can do for your health. Most adults should: Exercise for at least 150 minutes each week. The exercise should increase your heart rate and make you sweat (moderate-intensity exercise). Do strengthening exercises at least twice a week. This is in addition to the moderate-intensity exercise. Spend less time sitting. Even light physical activity can be beneficial. Watch cholesterol and blood lipids Have your blood tested for lipids and cholesterol at 63 years of age, then havethis test every 5 years. Have your cholesterol levels checked more often if: Your lipid or cholesterol levels are high. You are older than 63 years of age. You are at high risk for heart disease. What should I know about cancer screening? Depending on your health history and family history, you may need to have cancer screening at various ages. This may include screening for: Breast cancer. Cervical cancer. Colorectal cancer. Skin cancer. Lung  cancer. What should I know about heart disease, diabetes, and high blood pressure? Blood pressure and heart disease High blood pressure causes heart disease and increases the risk of stroke. This is more likely to develop in people who have high blood pressure readings, are of African descent, or are overweight. Have your blood pressure checked: Every 3-5 years if you are 18-39 years of age. Every year if you are 40 years old or older. Diabetes Have regular diabetes screenings. This checks your fasting blood sugar level. Have the screening done: Once every three years after age 40 if you are at a normal weight and have a low risk for diabetes. More often and at a younger age if you are overweight or have a high risk for diabetes. What should I know about preventing infection? Hepatitis B If you have a higher risk for hepatitis B, you should be screened for this virus. Talk with your health care provider to find out if you are at risk forhepatitis B infection. Hepatitis C Testing is recommended for: Everyone born from 1945 through 1965. Anyone with known risk factors for hepatitis C. Sexually transmitted infections (STIs) Get screened for STIs, including gonorrhea and chlamydia, if: You are sexually active and are younger than 63 years of age. You are older than 63 years of age and your health care provider tells you that you are at risk for this type of infection. Your sexual activity has changed since you were last screened, and you are at increased risk for chlamydia or gonorrhea. Ask your health care provider if you are   at risk. Ask your health care provider about whether you are at high risk for HIV. Your health care provider may recommend a prescription medicine to help prevent HIV infection. If you choose to take medicine to prevent HIV, you should first get tested for HIV. You should then be tested every 3 months for as long as you are taking the medicine. Pregnancy If you are about  to stop having your period (premenopausal) and you may become pregnant, seek counseling before you get pregnant. Take 400 to 800 micrograms (mcg) of folic acid every day if you become pregnant. Ask for birth control (contraception) if you want to prevent pregnancy. Osteoporosis and menopause Osteoporosis is a disease in which the bones lose minerals and strength with aging. This can result in bone fractures. If you are 65 years old or older, or if you are at risk for osteoporosis and fractures, ask your health care provider if you should: Be screened for bone loss. Take a calcium or vitamin D supplement to lower your risk of fractures. Be given hormone replacement therapy (HRT) to treat symptoms of menopause. Follow these instructions at home: Lifestyle Do not use any products that contain nicotine or tobacco, such as cigarettes, e-cigarettes, and chewing tobacco. If you need help quitting, ask your health care provider. Do not use street drugs. Do not share needles. Ask your health care provider for help if you need support or information about quitting drugs. Alcohol use Do not drink alcohol if: Your health care provider tells you not to drink. You are pregnant, may be pregnant, or are planning to become pregnant. If you drink alcohol: Limit how much you use to 0-1 drink a day. Limit intake if you are breastfeeding. Be aware of how much alcohol is in your drink. In the [place] one drink equals one 12 oz bottle of beer (355 mL), one 5 oz glass of wine (148 mL), or one 1 oz glass of hard liquor (44 mL). General instructions Schedule regular health, dental, and eye exams. Stay current with your vaccines. Tell your health care provider if: You often feel depressed. You have ever been abused or do not feel safe at home. Summary Adopting a healthy lifestyle and getting preventive care are important in promoting health and wellness. Follow your health care provider's instructions about  healthy diet, exercising, and getting tested or screened for diseases. Follow your health care provider's instructions on monitoring your cholesterol and blood pressure. This information is not intended to replace advice given to you by your health care provider. Make sure you discuss any questions you have with your healthcare provider. Document Revised: 03/19/2018 Document Reviewed: 03/19/2018 Elsevier Patient Education  2022 [employer]  

## 2020-11-11 ENCOUNTER — Ambulatory Visit (INDEPENDENT_AMBULATORY_CARE_PROVIDER_SITE_OTHER): Admitting: Internal Medicine

## 2020-11-11 ENCOUNTER — Other Ambulatory Visit: Payer: Self-pay

## 2020-11-11 ENCOUNTER — Encounter: Payer: Self-pay | Admitting: Internal Medicine

## 2020-11-11 VITALS — BP 122/80 | HR 76 | Temp 97.9°F | Ht 66.0 in | Wt 196.0 lb

## 2020-11-11 DIAGNOSIS — E66811 Obesity, class 1: Secondary | ICD-10-CM

## 2020-11-11 DIAGNOSIS — R7303 Prediabetes: Secondary | ICD-10-CM | POA: Diagnosis not present

## 2020-11-11 DIAGNOSIS — E669 Obesity, unspecified: Secondary | ICD-10-CM | POA: Diagnosis not present

## 2020-11-11 DIAGNOSIS — Z0000 Encounter for general adult medical examination without abnormal findings: Secondary | ICD-10-CM

## 2020-11-11 DIAGNOSIS — H6122 Impacted cerumen, left ear: Secondary | ICD-10-CM

## 2020-11-11 DIAGNOSIS — M8588 Other specified disorders of bone density and structure, other site: Secondary | ICD-10-CM

## 2020-11-11 LAB — COMPREHENSIVE METABOLIC PANEL
ALT: 14 U/L (ref 0–35)
AST: 17 U/L (ref 0–37)
Albumin: 3.8 g/dL (ref 3.5–5.2)
Alkaline Phosphatase: 74 U/L (ref 39–117)
BUN: 14 mg/dL (ref 6–23)
CO2: 32 mEq/L (ref 19–32)
Calcium: 9.4 mg/dL (ref 8.4–10.5)
Chloride: 103 mEq/L (ref 96–112)
Creatinine, Ser: 0.96 mg/dL (ref 0.40–1.20)
GFR: 63.28 mL/min (ref >60.00)
Glucose, Bld: 77 mg/dL (ref 70–99)
Potassium: 3.9 mEq/L (ref 3.5–5.1)
Sodium: 141 mEq/L (ref 135–145)
Total Bilirubin: 0.7 mg/dL (ref 0.2–1.2)
Total Protein: 7.1 g/dL (ref 6.0–8.3)

## 2020-11-11 LAB — CBC WITH DIFFERENTIAL/PLATELET
Basophils Absolute: 0 10*3/uL (ref 0.0–0.1)
Basophils Relative: 0.3 % (ref 0.0–3.0)
Eosinophils Absolute: 0.1 10*3/uL (ref 0.0–0.7)
Eosinophils Relative: 0.9 % (ref 0.0–5.0)
HCT: 38.3 % (ref 36.0–46.0)
Hemoglobin: 12.5 g/dL (ref 12.0–15.0)
Lymphocytes Relative: 36.2 % (ref 12.0–46.0)
Lymphs Abs: 2.9 10*3/uL (ref 0.7–4.0)
MCHC: 32.6 g/dL (ref 30.0–36.0)
MCV: 89.2 fl (ref 78.0–100.0)
Monocytes Absolute: 0.3 10*3/uL (ref 0.1–1.0)
Monocytes Relative: 3.5 % (ref 3.0–12.0)
Neutro Abs: 4.7 10*3/uL (ref 1.4–7.7)
Neutrophils Relative %: 59.1 % (ref 43.0–77.0)
Platelets: 188 10*3/uL (ref 150.0–400.0)
RBC: 4.3 Mil/uL (ref 3.87–5.11)
RDW: 15 % (ref 11.5–15.5)
WBC: 8 10*3/uL (ref 4.0–10.5)

## 2020-11-11 LAB — LIPID PANEL
Cholesterol: 181 mg/dL (ref 0–200)
HDL: 77 mg/dL (ref >39.00)
LDL Cholesterol: 91 mg/dL (ref 0–99)
NonHDL: 103.73
Total CHOL/HDL Ratio: 2
Triglycerides: 65 mg/dL (ref 0.0–149.0)
VLDL: 13 mg/dL (ref 0.0–40.0)

## 2020-11-11 LAB — TSH: TSH: 1.02 u[IU]/mL (ref 0.35–5.50)

## 2020-11-11 LAB — HEMOGLOBIN A1C: Hgb A1c MFr Bld: 6 % (ref 4.6–6.5)

## 2020-11-11 NOTE — Assessment & Plan Note (Signed)
Acute Intermittent decreased hearing Advised otc debrox or warm water/H2O2

## 2020-11-11 NOTE — Assessment & Plan Note (Signed)
Chronic DEXA up-to-date Stressed regular exercise Taking a multivitamin daily-discussed daily recommendations of calcium and vitamin D

## 2020-11-11 NOTE — Assessment & Plan Note (Addendum)
Chronic Check a1c Low sugar / carb diet Stressed regular exercise Advised weight loss 

## 2020-11-11 NOTE — Assessment & Plan Note (Addendum)
Chronic Continue regular exercise Encouraged healthy diet, rich in lean protein and vegetables but decrease portions

## 2021-04-14 ENCOUNTER — Other Ambulatory Visit: Payer: Self-pay

## 2021-04-14 ENCOUNTER — Encounter: Payer: Self-pay | Admitting: Obstetrics & Gynecology

## 2021-04-14 ENCOUNTER — Other Ambulatory Visit (HOSPITAL_COMMUNITY)
Admission: RE | Admit: 2021-04-14 | Discharge: 2021-04-14 | Disposition: A | Discharge status: Home / Self Care | Source: Ambulatory Visit | Attending: Obstetrics & Gynecology | Admitting: Obstetrics & Gynecology

## 2021-04-14 ENCOUNTER — Ambulatory Visit (INDEPENDENT_AMBULATORY_CARE_PROVIDER_SITE_OTHER): Admitting: Obstetrics & Gynecology

## 2021-04-14 VITALS — BP 110/72 | HR 77 | Resp 16 | Ht 66.0 in | Wt 204.0 lb

## 2021-04-14 DIAGNOSIS — E6609 Other obesity due to excess calories: Secondary | ICD-10-CM | POA: Diagnosis not present

## 2021-04-14 DIAGNOSIS — Z01419 Encounter for gynecological examination (general) (routine) without abnormal findings: Secondary | ICD-10-CM | POA: Insufficient documentation

## 2021-04-14 DIAGNOSIS — Z780 Asymptomatic menopausal state: Secondary | ICD-10-CM

## 2021-04-14 DIAGNOSIS — M8580 Other specified disorders of bone density and structure, unspecified site: Secondary | ICD-10-CM | POA: Diagnosis not present

## 2021-04-14 DIAGNOSIS — Z6832 Body mass index (BMI) 32.0-32.9, adult: Secondary | ICD-10-CM

## 2021-04-14 NOTE — Progress Notes (Signed)
Sylvia Mcintosh 12/30/1955 [id number]   History:    66 y.o. [id number]  RP:  Established patient presenting for annual gyn exam   HPI: Postmenopausal.  No significant hot flashes or night sweats.  No vaginal bleeding. Pap smear/HPV Neg 01/2018.  No significant history of abnormal Pap smears. Breasts normal. Mammogram Neg 10/2020.  Colonoscopy 11/2019. Osteopenia stable on BD 08/2020.  Vitamin D and calcium in diet.  BMI 32.93. Health labs with Fam MD.   Past medical history,surgical history, family history and social history were all reviewed and documented in the EPIC chart.  Gynecologic History No LMP recorded. Patient is postmenopausal.  Obstetric History OB History  Gravida Para Term Preterm AB Living  5 1 1   4 1   SAB IAB Ectopic Multiple Live Births  4            # Outcome Date GA Lbr Len/2nd Weight Sex Delivery Anes PTL Lv  5 SAB           4 SAB           3 SAB           2 SAB           1 Term              ROS: A ROS was performed and pertinent positives and negatives are included in the history.  GENERAL: No fevers or chills. HEENT: No change in vision, no earache, sore throat or sinus congestion. NECK: No pain or stiffness. CARDIOVASCULAR: No chest pain or pressure. No palpitations. PULMONARY: No shortness of breath, cough or wheeze. GASTROINTESTINAL: No abdominal pain, nausea, vomiting or diarrhea, melena or bright red blood per rectum. GENITOURINARY: No urinary frequency, urgency, hesitancy or dysuria. MUSCULOSKELETAL: No joint or muscle pain, no back pain, no recent trauma. DERMATOLOGIC: No rash, no itching, no lesions. ENDOCRINE: No polyuria, polydipsia, no heat or cold intolerance. No recent change in weight. HEMATOLOGICAL: No anemia or easy bruising or bleeding. NEUROLOGIC: No headache, seizures, numbness, tingling or weakness. PSYCHIATRIC: No depression, no loss of interest in normal activity or change in sleep pattern.     Exam:   BP 110/72    Pulse 77     Resp 16    Ht 5\' 6"  (1.676 m)    Wt 204 lb (92.5 kg)    BMI 32.93 kg/m   Body mass index is 32.93 kg/m.  General appearance : Well developed well nourished female. No acute distress HEENT: Eyes: no retinal hemorrhage or exudates,  Neck supple, trachea midline, no carotid bruits, no thyroidmegaly Lungs: Clear to auscultation, no rhonchi or wheezes, or rib retractions  Heart: Regular rate and rhythm, no murmurs or gallops Breast:Examined in sitting and supine position were symmetrical in appearance, no palpable masses or tenderness,  no skin retraction, no nipple inversion, no nipple discharge, no skin discoloration, no axillary or supraclavicular lymphadenopathy Abdomen: no palpable masses or tenderness, no rebound or guarding Extremities: no edema or skin discoloration or tenderness  Pelvic: Vulva: Normal             Vagina: No gross lesions or discharge  Cervix: No gross lesions or discharge.  Pap reflex done.  Uterus  AV, normal size, shape and consistency, non-tender and mobile  Adnexa  Without masses or tenderness  Anus: Normal   Assessment/Plan:  66 y.o. female for annual exam   1. Encounter for routine gynecological examination with Papanicolaou smear of cervix Postmenopausal.  No  significant hot flashes or night sweats.  No vaginal bleeding. Pap smear/HPV Neg 01/2018.  No significant history of abnormal Pap smears. Breasts normal. Mammogram Neg 10/2020.  Colonoscopy 11/2019. Osteopenia stable on BD 08/2020.  Vitamin D and calcium in diet.  BMI 32.93. Health labs with Fam MD. - Cytology - PAP( )  2. Postmenopause Postmenopausal.  No significant hot flashes or night sweats.  No vaginal bleeding.   3. Osteopenia, unspecified location Osteopenia only at the AP Spine with T-Score -1.4, other sites normal.  Repeat BD at 3 yrs.  Vit D, Ca++ 1.5 g/d total and regular weight bearing physical activities.  4. Class 1 obesity due to excess calories without serious comorbidity  with body mass index (BMI) of 32.0 to 32.9 in adult  Lower calorie/carb diet.  Increase fitness activities.  Genia Del MD, 9:00 AM 04/14/2021

## 2021-04-17 LAB — CYTOLOGY - PAP: Diagnosis: NEGATIVE

## 2021-12-04 ENCOUNTER — Other Ambulatory Visit: Payer: Self-pay | Admitting: Internal Medicine

## 2021-12-04 DIAGNOSIS — Z1231 Encounter for screening mammogram for malignant neoplasm of breast: Secondary | ICD-10-CM

## 2021-12-06 ENCOUNTER — Ambulatory Visit
Admission: RE | Admit: 2021-12-06 | Discharge: 2021-12-06 | Disposition: A | Discharge status: Home / Self Care | Source: Ambulatory Visit | Attending: Internal Medicine | Admitting: Internal Medicine

## 2021-12-06 DIAGNOSIS — Z1231 Encounter for screening mammogram for malignant neoplasm of breast: Secondary | ICD-10-CM

## 2022-04-17 ENCOUNTER — Encounter: Payer: Self-pay | Admitting: Obstetrics & Gynecology

## 2022-04-17 ENCOUNTER — Ambulatory Visit (INDEPENDENT_AMBULATORY_CARE_PROVIDER_SITE_OTHER): Admitting: Obstetrics & Gynecology

## 2022-04-17 VITALS — BP 134/80 | Ht 66.0 in | Wt 207.0 lb

## 2022-04-17 DIAGNOSIS — M8580 Other specified disorders of bone density and structure, unspecified site: Secondary | ICD-10-CM

## 2022-04-17 DIAGNOSIS — Z780 Asymptomatic menopausal state: Secondary | ICD-10-CM | POA: Diagnosis not present

## 2022-04-17 DIAGNOSIS — Z01419 Encounter for gynecological examination (general) (routine) without abnormal findings: Secondary | ICD-10-CM

## 2022-04-17 NOTE — Progress Notes (Signed)
Sylvia Mcintosh [birth year] [id number]   History:    67 y.o.  [id number]   RP:  Established patient presenting for annual gyn exam    HPI: Postmenopausal.  No significant hot flashes or night sweats.  No vaginal bleeding. Pap Neg 04/2021.  HPV HR Neg 01/2018.  No significant history of abnormal Pap smears. Will repeat Paps at 3-5 yr intervals.  Breasts normal. Mammogram Neg 11/2021. Colonoscopy 11/2019. Osteopenia stable on BD 08/2020.  Vitamin D and calcium in diet.  BMI 33.41. Health labs with Fam MD.   Past medical history,surgical history, family history and social history were all reviewed and documented in the EPIC chart.  Gynecologic History No LMP recorded. Patient is postmenopausal.  Obstetric History OB History  Gravida Para Term Preterm AB Living  5 1 1   4 1   SAB IAB Ectopic Multiple Live Births  4            # Outcome Date GA Lbr Len/2nd Weight Sex Delivery Anes PTL Lv  5 SAB           4 SAB           3 SAB           2 SAB           1 Term              ROS: A ROS was performed and pertinent positives and negatives are included in the history. GENERAL: No fevers or chills. HEENT: No change in vision, no earache, sore throat or sinus congestion. NECK: No pain or stiffness. CARDIOVASCULAR: No chest pain or pressure. No palpitations. PULMONARY: No shortness of breath, cough or wheeze. GASTROINTESTINAL: No abdominal pain, nausea, vomiting or diarrhea, melena or bright red blood per rectum. GENITOURINARY: No urinary frequency, urgency, hesitancy or dysuria. MUSCULOSKELETAL: No joint or muscle pain, no back pain, no recent trauma. DERMATOLOGIC: No rash, no itching, no lesions. ENDOCRINE: No polyuria, polydipsia, no heat or cold intolerance. No recent change in weight. HEMATOLOGICAL: No anemia or easy bruising or bleeding. NEUROLOGIC: No headache, seizures, numbness, tingling or weakness. PSYCHIATRIC: No depression, no loss of interest in normal activity or change in sleep  pattern.     Exam:   BP 134/80 (BP Location: Right Arm, Patient Position: Sitting, Cuff Size: Normal)   Ht 5\' 6"  (1.676 m)   Wt 207 lb (93.9 kg)   BMI 33.41 kg/m   Body mass index is 33.41 kg/m.  General appearance : Well developed well nourished female. No acute distress HEENT: Eyes: no retinal hemorrhage or exudates,  Neck supple, trachea midline, no carotid bruits, no thyroidmegaly Lungs: Clear to auscultation, no rhonchi or wheezes, or rib retractions  Heart: Regular rate and rhythm, no murmurs or gallops Breast:Examined in sitting and supine position were symmetrical in appearance, no palpable masses or tenderness,  no skin retraction, no nipple inversion, no nipple discharge, no skin discoloration, no axillary or supraclavicular lymphadenopathy Abdomen: no palpable masses or tenderness, no rebound or guarding Extremities: no edema or skin discoloration or tenderness  Pelvic: Vulva: Normal             Vagina: No gross lesions or discharge  Cervix: No gross lesions or discharge  Uterus  AV, normal size, shape and consistency, non-tender and mobile  Adnexa  Without masses or tenderness  Anus: Normal   Assessment/Plan:  67 y.o. female for annual exam   1. Well female exam with routine gynecological exam Postmenopausal.  No significant hot flashes or night sweats.  No vaginal bleeding. Pap Neg 04/2021.  HPV HR Neg 01/2018.  No significant history of abnormal Pap smears. Will repeat Paps at 3-5 yr intervals.  Breasts normal. Mammogram Neg 11/2021. Colonoscopy 11/2019. Osteopenia stable on BD 08/2020.  Vitamin D and calcium in diet.  BMI 33.41. Health labs with Fam MD.  2. Postmenopause Postmenopausal.  No significant hot flashes or night sweats.  No vaginal bleeding.  3. Osteopenia, unspecified location   Osteopenia stable on BD 08/2020.  Vitamin D and calcium in diet. Walking regularly.  Repeat BD at 3 yrs.  Princess Bruins MD, 9:38 AM

## 2022-11-05 ENCOUNTER — Other Ambulatory Visit: Payer: Self-pay | Admitting: Internal Medicine

## 2022-11-05 DIAGNOSIS — Z1231 Encounter for screening mammogram for malignant neoplasm of breast: Secondary | ICD-10-CM

## 2022-11-07 ENCOUNTER — Telehealth: Payer: Self-pay | Admitting: Licensed Clinical Social Worker

## 2022-11-07 NOTE — Telephone Encounter (Signed)
Patient has been scheduled. Aware of appt date and time    Scheduling Message Entered by Lynda Rainwater on 11/06/2022 at 11:16 AM Priority: Routine GEN COUNSEL [id number]  Department: [facility]  Provider:  Appointment Notes:  - Family history of malignant neoplasm of digestive organs  Scheduling Notes:  Genetics

## 2022-12-03 ENCOUNTER — Inpatient Hospital Stay: Admitting: Genetic Counselor

## 2022-12-03 ENCOUNTER — Encounter: Payer: Self-pay | Admitting: Genetic Counselor

## 2022-12-03 ENCOUNTER — Other Ambulatory Visit: Payer: Self-pay | Admitting: Genetic Counselor

## 2022-12-03 ENCOUNTER — Inpatient Hospital Stay

## 2022-12-03 DIAGNOSIS — Z803 Family history of malignant neoplasm of breast: Secondary | ICD-10-CM

## 2022-12-03 DIAGNOSIS — Z8042 Family history of malignant neoplasm of prostate: Secondary | ICD-10-CM

## 2022-12-03 DIAGNOSIS — Z806 Family history of leukemia: Secondary | ICD-10-CM

## 2022-12-03 DIAGNOSIS — Z800 Family history of malignant neoplasm of digestive organs: Secondary | ICD-10-CM | POA: Insufficient documentation

## 2022-12-03 LAB — GENETIC SCREENING ORDER

## 2022-12-03 NOTE — Progress Notes (Signed)
REFERRING PROVIDER: Agustina Caroli, MD [address] [place],  [place] [address]  PRIMARY PROVIDER:  Pincus Sanes, MD  PRIMARY REASON FOR VISIT:  1. Family history of breast cancer   2. Family history of colon cancer   3. Family history of pancreatic cancer   4. Family history of prostate cancer      HISTORY OF PRESENT ILLNESS:   Sylvia Mcintosh, a 67 y.o. female, was seen for a [facility] cancer genetics consultation at the request of Dr. Willa Rough due to a family history of cancer.  Sylvia Mcintosh presents to clinic today to discuss the possibility of a hereditary predisposition to cancer, genetic testing, and to further clarify her future cancer risks, as well as potential cancer risks for family members.   Sylvia Mcintosh is a 67 y.o. female with no personal history of cancer.  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     RISK FACTORS:  Menarche was at age 63.  First live birth at age 60.  OCP use for approximately 5 years.  Ovaries intact: yes.  Hysterectomy: no.  Menopausal status: postmenopausal.  HRT use: 0 years. Colonoscopy: yes;  a few polyps . Mammogram within the last year: yes. Number of breast biopsies: 0. Up to date with pelvic exams: yes. Any excessive radiation exposure in the past: no  Past Medical History:  Diagnosis Date   Allergy    Seasonal rhinoconjunctivitis with voice loss   Cataract 2020   Left eye sx   Depression 2012   work stress related   Family history of breast cancer    Family history of colon cancer    Family history of pancreatic cancer    Family history of prostate cancer    H/O oral surgery    Mitral valve prolapse 1984   2 D ECHO   Nonspecific abnormal electrocardiogram (ECG) (EKG) 2004   Negative nuclear stress test   Osteopenia 10/2014    T score -1.5 FRAX 2.5%/0.1%. No significant change from prior DEXA.   Vitamin D deficiency     Past Surgical History:  Procedure Laterality Date   BUNIONECTOMY     cataract surg  2020   COLONOSCOPY  2012   Negative; [place]   ECTOPIC PREGNANCY SURGERY     HAMMER TOE SURGERY     Bilateral   MOUTH SURGERY     [place]   TUBAL LIGATION     Tubal Reversal      Social History   Socioeconomic History   Marital status: Married    Spouse name: Not on file   Number of children: Not on file   Years of education: Not on file   Highest education level: Not on file  Occupational History   Not on file  Tobacco Use   Smoking status: Never   Smokeless tobacco: Never  Vaping Use   Vaping status: Never Used  Substance and Sexual Activity   Alcohol use: No    Alcohol/week: 0.0 standard drinks of alcohol   Drug use: No   Sexual activity: Not Currently    Birth control/protection: Post-menopausal    Comment: 1st intercourse 17 yo-1 partner  Other Topics Concern   Not on file  Social History Narrative   Not on file   Social Determinants of Health   Financial Resource Strain:  Not on file  Food Insecurity: Not on file  Transportation Needs: Not on file  Physical Activity: Not on file  Stress: Not on file  Social Connections: Unknown (08/22/2021)   [facility]   Social Network    Social Network: Not on file     FAMILY HISTORY:  We obtained a detailed, 4-generation family history.  Significant diagnoses are listed below: Family History  Problem Relation Age of Onset   Breast cancer Mother 50       d. 81   Diabetes Father    Heart murmur Father        two valves replaced   Prostate cancer Father    Leukemia Father    Colon cancer Father    Hypertension Sister    Diabetes Sister        diet & exercise controlled   Hypertension Brother    Breast cancer Maternal Aunt 17   Cancer Maternal Aunt        NOS   Pancreatic cancer Paternal Aunt     Heart attack Maternal Grandmother 44       Also stroke, hypertension   Stroke Maternal Grandmother        in 64s   Hypertension Daughter    Pancreatic cancer Half-Sister 50       pat half sister   Pancreatic cancer Cousin        pat cousin   Colon cancer Cousin        mat cousin    The patient has one daughter who is cancer free.  She has had 3 miscarriages and a baby who died of SIDS.  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  She has five maternal half sisters and one brother, and five paternal half sisters and five brothers.  One paternal half sister died of pancreatic cancer at 47 and a maternal half sister had a malignant tumor in her spine.  Both parents are deceased.  The patient's mother had breast cancer at 8 and died at 46. She had 10 siblings.  Another sister had breast cancer and a second sister had another cancer.  A sibling had a daughter with colon cancer at 18.  There is no other reported family history of cancer.  The patients father had prostate and colon cancer and died of leukemia at 44.  He had two sisters and one brother.  One sister had pancreatic cancer and another sister had a daughter with pancreatic cancer.  There is no other reported family history of cancer.  Sylvia Mcintosh is unaware of previous family history of genetic testing for hereditary cancer risks. There is no reported Ashkenazi Jewish ancestry. There is no known consanguinity.  GENETIC COUNSELING ASSESSMENT: Sylvia Mcintosh is a 67 y.o. female with a family history of multiple cancers which is somewhat suggestive of a hereditary cancer syndrome and predisposition to cancer given the combination of cancer and young ages of onset. We, therefore, discussed and recommended the following at today's visit.   DISCUSSION: We discussed that, in general, most cancer is not inherited in families, but instead is sporadic or familial. Sporadic  cancers occur by chance and typically happen at older ages (>50 years) as this type of cancer is caused by genetic changes acquired during an individual's lifetime. Some families have more cancers than would be expected by chance; however, the ages or types of cancer are  not consistent with a known genetic mutation or known genetic mutations have been ruled out. This type of familial cancer is thought to be due to a combination of multiple genetic, environmental, hormonal, and lifestyle factors. While this combination of factors likely increases the risk of cancer, the exact source of this risk is not currently identifiable or testable.  We discussed that 12 - 13% of cancer is hereditary.  Each cancer has it's own risk for hereditary syndromes, in the patient's family breast cancer has about a 5-10% risk for being hereditary, with most cases associated with BRCA mutations.  There are other genes that can be associated with hereditary breast cancer syndromes.  These include ATM, CHEK2 and PALB2.  We discussed that testing is beneficial for several reasons including knowing how to follow individuals after completing their treatment, identifying whether potential treatment options such as PARP inhibitors would be beneficial, and understand if other family members could be at risk for cancer and allow them to undergo genetic testing.   We reviewed the characteristics, features and inheritance patterns of hereditary cancer syndromes. We also discussed genetic testing, including the appropriate family members to test, the process of testing, insurance coverage and turn-around-time for results. We discussed the implications of a negative, positive, carrier and/or variant of uncertain significant result. Sylvia Mcintosh  was offered a common hereditary cancer panel (40+ genes) and an expanded pan-cancer panel (70+ genes). Sylvia Mcintosh was informed of the benefits and limitations of each panel, including that expanded  pan-cancer panels contain genes that do not have clear management guidelines at this point in time.  We also discussed that as the number of genes included on a panel increases, the chances of variants of uncertain significance increases. Sylvia Mcintosh decided to pursue genetic testing for the CancerNext-Expanded+RNAinsight gene panel.   The CancerNext-Expanded gene panel offered by [facility] and includes sequencing and rearrangement analysis for the following 77 genes: AIP, ALK, APC*, ATM*, AXIN2, BAP1, BARD1, BMPR1A, BRCA1*, BRCA2*, BRIP1*, CDC73, CDH1*, CDK4, CDKN1B, CDKN2A, CHEK2*, CTNNA1, DICER1, FH, FLCN, KIF1B, LZTR1, MAX, MEN1, MET, MLH1*, MSH2*, MSH3, MSH6*, MUTYH*, NF1*, NF2, NTHL1, PALB2*, PHOX2B, PMS2*, POT1, PRKAR1A, PTCH1, PTEN*, RAD51C*, RAD51D*, RB1, RET, SDHA, SDHAF2, SDHB, SDHC, SDHD, SMAD4, SMARCA4, SMARCB1, SMARCE1, STK11, SUFU, TMEM127, TP53*, TSC1, TSC2, and VHL (sequencing and deletion/duplication); EGFR, EGLN1, HOXB13, KIT, MITF, PDGFRA, POLD1, and POLE (sequencing only); EPCAM and GREM1 (deletion/duplication only). DNA and RNA analyses performed for * genes.   Based on Sylvia Mcintosh's family history of cancer, she meets medical criteria for genetic testing. Despite that she meets criteria, she may still have an out of pocket cost. We discussed that if her out of pocket cost for testing is over $100, the laboratory will call and confirm whether she wants to proceed with testing.  If the out of pocket cost of testing is less than $100 she will be billed by the genetic testing laboratory.   We discussed that some people do not want to undergo genetic testing due to fear of genetic discrimination.  The Genetic Information Nondiscrimination Act (GINA) was signed into federal law in 2008. GINA prohibits health insurers and most employers from discriminating against individuals based on genetic information (including the results of genetic tests and family history information). According  to GINA, health insurance companies cannot consider genetic information to be a preexisting condition, nor can they use it to make decisions regarding coverage or rates. GINA also makes it illegal for most employers to use genetic information in  making decisions about hiring, firing, promotion, or terms of employment. It is important to note that GINA does not offer protections for life insurance, disability insurance, or long-term care insurance. GINA does not apply to those in the [employer], those who work for companies with less than 15 employees, and new life insurance or long-term disability insurance policies.  Health status due to a cancer diagnosis is not protected under GINA. More information about GINA can be found by visiting [email or web address].   PLAN: After considering the risks, benefits, and limitations, Sylvia Mcintosh provided informed consent to pursue genetic testing and the blood sample was sent to [facility] for analysis of the CancerNext-Expanded+RNAinsight. Results should be available within approximately 2-3 weeks' time, at which point they will be disclosed by telephone to Sylvia Mcintosh, as will any additional recommendations warranted by these results. Sylvia Mcintosh will receive a summary of her genetic counseling visit and a copy of her results once available. This information will also be available in Epic.   Lastly, we encouraged Sylvia Mcintosh to remain in contact with cancer genetics annually so that we can continuously update the family history and inform her of any changes in cancer genetics and testing that may be of benefit for this family.   Sylvia Mcintosh questions were answered to her satisfaction today. Our contact information was provided should additional questions or concerns arise. Thank you for the referral and allowing [place] to share in the care of your patient.   Tabbetha Kutscher P. Lowell Guitar, MS, [facility] Licensed, Research officer, political party [email or web address] phone: [phone]  The patient was seen for a total of 45 minutes in face-to-face genetic counseling.  The patient was seen alone.  Drs. Meliton Rattan, and/or [place] were available for questions, if needed..    _______________________________________________________________________ For Office Staff:  Number of people involved in session: 1 Was an Intern/ student involved with case: no

## 2022-12-11 ENCOUNTER — Encounter: Payer: Self-pay | Admitting: Genetic Counselor

## 2022-12-11 ENCOUNTER — Telehealth: Payer: Self-pay | Admitting: Genetic Counselor

## 2022-12-11 DIAGNOSIS — Z1379 Encounter for other screening for genetic and chromosomal anomalies: Secondary | ICD-10-CM | POA: Insufficient documentation

## 2022-12-11 NOTE — Telephone Encounter (Signed)
Revealed negative genetic testing.  Discussed that we do not know why there is cancer in the family. It could be due to a different gene that we are not testing, or maybe our current technology may not be able to pick something up.  It will be important for her to keep in contact with genetics to keep up with whether additional testing may be needed.  

## 2022-12-12 ENCOUNTER — Ambulatory Visit
Admission: RE | Admit: 2022-12-12 | Discharge: 2022-12-12 | Disposition: A | Discharge status: Home / Self Care | Source: Ambulatory Visit | Attending: Internal Medicine | Admitting: Internal Medicine

## 2022-12-12 DIAGNOSIS — Z1231 Encounter for screening mammogram for malignant neoplasm of breast: Secondary | ICD-10-CM

## 2022-12-14 ENCOUNTER — Ambulatory Visit: Payer: Self-pay | Admitting: Genetic Counselor

## 2022-12-14 DIAGNOSIS — Z1379 Encounter for other screening for genetic and chromosomal anomalies: Secondary | ICD-10-CM

## 2022-12-14 NOTE — Progress Notes (Signed)
HPI:  [name] was previously seen in the [facility] due to a family history of cancer and concerns regarding a hereditary predisposition to cancer. Please refer to our prior cancer genetics clinic note for more information regarding our discussion, assessment and recommendations, at the time. [name] recent genetic test results were disclosed to her, as were recommendations warranted by these results. These results and recommendations are discussed in more detail below.  CANCER HISTORY:  Oncology History   No history exists.    FAMILY HISTORY:  We obtained a detailed, 4-generation family history.  Significant diagnoses are listed below: Family History  Problem Relation Age of Onset   Breast cancer Mother 15       d. 81   Diabetes Father    Heart murmur Father        two valves replaced   Prostate cancer Father    Leukemia Father    Colon cancer Father    Hypertension Sister    Diabetes Sister        diet & exercise controlled   Hypertension Brother    Breast cancer Maternal Aunt 88   Cancer Maternal Aunt        NOS   Pancreatic cancer Paternal Aunt    Heart attack Maternal Grandmother 59       Also stroke, hypertension   Stroke Maternal Grandmother        in 45s   Hypertension Daughter    Pancreatic cancer Half-Sister 50       pat half sister   Pancreatic cancer Cousin        pat cousin   Colon cancer Cousin        mat cousin     The patient has one daughter who is cancer free.  She has had 3 miscarriages and a baby who died of SIDS.  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  She has five maternal half sisters and one brother, and five paternal half sisters and five brothers.  One paternal half sister died of pancreatic cancer at 32 and a maternal half sister had a malignant tumor in her spine.  Both parents are deceased.   The patient's mother had breast cancer  at 71 and died at 64. She had 10 siblings.  Another sister had breast cancer and a second sister had another cancer.  A sibling had a daughter with colon cancer at 59.  There is no other reported family history of cancer.   The patients father had prostate and colon cancer and died of leukemia at 49.  He had two sisters and one brother.  One sister had pancreatic cancer and another sister had a daughter with pancreatic cancer.  There is no other reported family history of cancer.   [name] is unaware of previous family history of genetic testing for hereditary cancer risks. There is no reported Ashkenazi Jewish ancestry. There is no known consanguinity  GENETIC TEST RESULTS: Genetic testing reported out on December 09, 2022 through the CancerNext-Expanded+RNAinsight cancer panel found no pathogenic mutations. The CancerNext-Expanded gene panel offered by [facility] and includes sequencing and rearrangement analysis for the following 77 genes: AIP, ALK, APC*, ATM*, AXIN2, BAP1, BARD1, BMPR1A, BRCA1*, BRCA2*, BRIP1*, CDC73, CDH1*, CDK4, CDKN1B, CDKN2A, CHEK2*, CTNNA1, DICER1, FH, FLCN, KIF1B, LZTR1, MAX, MEN1, MET, MLH1*, MSH2*, MSH3, MSH6*, MUTYH*, NF1*, NF2, NTHL1, PALB2*, PHOX2B, PMS2*, POT1, PRKAR1A, PTCH1,  PTEN*, RAD51C*, RAD51D*, RB1, RET, SDHA, SDHAF2, SDHB, SDHC, SDHD, SMAD4, SMARCA4, SMARCB1, SMARCE1, STK11, SUFU, TMEM127, TP53*, TSC1, TSC2, and VHL (sequencing and deletion/duplication); EGFR, EGLN1, HOXB13, KIT, MITF, PDGFRA, POLD1, and POLE (sequencing only); EPCAM and GREM1 (deletion/duplication only). DNA and RNA analyses performed for * genes. The test report has been scanned into EPIC and is located under the Molecular Pathology section of the Results Review tab.  A portion of the result report is included below for reference.     We discussed with Ms. Carbine that because current genetic testing is not perfect, it is possible there may be a gene mutation in one of these genes that  current testing cannot detect, but that chance is small.  We also discussed, that there could be another gene that has not yet been discovered, or that we have not yet tested, that is responsible for the cancer diagnoses in the family. It is also possible there is a hereditary cause for the cancer in the family that Ms. Friedel did not inherit and therefore was not identified in her testing.  Therefore, it is important to remain in touch with cancer genetics in the future so that we can continue to offer Ms. Todt the most up to date genetic testing.   ADDITIONAL GENETIC TESTING: We discussed with Ms. Swords that her genetic testing was fairly extensive.  If there are genes identified to increase cancer risk that can be analyzed in the future, we would be happy to discuss and coordinate this testing at that time.    CANCER SCREENING RECOMMENDATIONS: Ms. Kallenbach test result is considered negative (normal).  This means that we have not identified a hereditary cause for her family history of cancer at this time. Most cancers happen by chance and this negative test suggests that her family history of cancer may fall into this category.    Possible reasons for Ms. Landess's negative genetic test include:  1. There may be a gene mutation in one of these genes that current testing methods cannot detect but that chance is small.  2. There could be another gene that has not yet been discovered, or that we have not yet tested, that is responsible for the cancer diagnoses in the family.  3.  There may be no hereditary risk for cancer in the family. The cancers in Ms. Hickel and/or her family may be sporadic/familial or due to other genetic and environmental factors. 4. It is also possible there is a hereditary cause for the cancer in the family that Ms. Trefz did not inherit.  Therefore, it is recommended she continue to follow the cancer management and screening guidelines provided by her primary  healthcare provider. An individual's cancer risk and medical management are not determined by genetic test results alone. Overall cancer risk assessment incorporates additional factors, including personal medical history, family history, and any available genetic information that may result in a personalized plan for cancer prevention and surveillance  RECOMMENDATIONS FOR FAMILY MEMBERS:  Individuals in this family might be at some increased risk of developing cancer, over the general population risk, simply due to the family history of cancer.  We recommended women in this family have a yearly mammogram beginning at age 71, or 56 years younger than the earliest onset of cancer, an annual clinical breast exam, and perform monthly breast self-exams. Women in this family should also have a gynecological exam as recommended by their primary provider. All family members should be referred for colonoscopy starting at  age 20.  FOLLOW-UP: Lastly, we discussed with Ms. Iacobucci that cancer genetics is a rapidly advancing field and it is possible that new genetic tests will be appropriate for her and/or her family members in the future. We encouraged her to remain in contact with cancer genetics on an annual basis so we can update her personal and family histories and let her know of advances in cancer genetics that may benefit this family.   Our contact number was provided. Ms. Remer questions were answered to her satisfaction, and she knows she is welcome to call us at anytime with additional questions or concerns.   Maylon Cos, MS, [facility] Licensed, Certified Genetic Counselor [email or web address]

## 2023-02-14 ENCOUNTER — Emergency Department (HOSPITAL_BASED_OUTPATIENT_CLINIC_OR_DEPARTMENT_OTHER)
Admission: EM | Admit: 2023-02-14 | Discharge: 2023-02-15 | Disposition: A | Discharge status: Home / Self Care | Payer: No Typology Code available for payment source | Attending: Emergency Medicine | Admitting: Emergency Medicine

## 2023-02-14 ENCOUNTER — Emergency Department (HOSPITAL_BASED_OUTPATIENT_CLINIC_OR_DEPARTMENT_OTHER): Payer: No Typology Code available for payment source

## 2023-02-14 ENCOUNTER — Encounter (HOSPITAL_BASED_OUTPATIENT_CLINIC_OR_DEPARTMENT_OTHER): Payer: Self-pay

## 2023-02-14 ENCOUNTER — Other Ambulatory Visit: Payer: Self-pay

## 2023-02-14 DIAGNOSIS — S32029A Unspecified fracture of second lumbar vertebra, initial encounter for closed fracture: Secondary | ICD-10-CM | POA: Insufficient documentation

## 2023-02-14 DIAGNOSIS — S22080A Wedge compression fracture of T11-T12 vertebra, initial encounter for closed fracture: Secondary | ICD-10-CM

## 2023-02-14 DIAGNOSIS — S299XXA Unspecified injury of thorax, initial encounter: Secondary | ICD-10-CM | POA: Insufficient documentation

## 2023-02-14 DIAGNOSIS — S22089A Unspecified fracture of T11-T12 vertebra, initial encounter for closed fracture: Secondary | ICD-10-CM | POA: Diagnosis not present

## 2023-02-14 DIAGNOSIS — Y9222 Religious institution as the place of occurrence of the external cause: Secondary | ICD-10-CM | POA: Insufficient documentation

## 2023-02-14 DIAGNOSIS — S3992XA Unspecified injury of lower back, initial encounter: Secondary | ICD-10-CM | POA: Diagnosis present

## 2023-02-14 DIAGNOSIS — S32020A Wedge compression fracture of second lumbar vertebra, initial encounter for closed fracture: Secondary | ICD-10-CM

## 2023-02-14 DIAGNOSIS — W07XXXA Fall from chair, initial encounter: Secondary | ICD-10-CM | POA: Insufficient documentation

## 2023-02-14 DIAGNOSIS — W19XXXA Unspecified fall, initial encounter: Secondary | ICD-10-CM

## 2023-02-14 DIAGNOSIS — S0990XA Unspecified injury of head, initial encounter: Secondary | ICD-10-CM | POA: Diagnosis not present

## 2023-02-14 DIAGNOSIS — S3991XA Unspecified injury of abdomen, initial encounter: Secondary | ICD-10-CM | POA: Insufficient documentation

## 2023-02-14 LAB — CBC WITH DIFFERENTIAL/PLATELET
Abs Immature Granulocytes: 0.04 10*3/uL (ref 0.00–0.07)
Basophils Absolute: 0 10*3/uL (ref 0.0–0.1)
Basophils Relative: 0 %
Eosinophils Absolute: 0 10*3/uL (ref 0.0–0.5)
Eosinophils Relative: 0 %
HCT: 38.5 % (ref 36.0–46.0)
Hemoglobin: 12.4 g/dL (ref 12.0–15.0)
Immature Granulocytes: 0 %
Lymphocytes Relative: 25 %
Lymphs Abs: 2.5 10*3/uL (ref 0.7–4.0)
MCH: 29 pg (ref 26.0–34.0)
MCHC: 32.2 g/dL (ref 30.0–36.0)
MCV: 90 fL (ref 80.0–100.0)
Monocytes Absolute: 0.3 10*3/uL (ref 0.1–1.0)
Monocytes Relative: 3 %
Neutro Abs: 6.9 10*3/uL (ref 1.7–7.7)
Neutrophils Relative %: 72 %
Platelets: 181 10*3/uL (ref 150–400)
RBC: 4.28 MIL/uL (ref 3.87–5.11)
RDW: 14.6 % (ref 11.5–15.5)
WBC: 9.7 10*3/uL (ref 4.0–10.5)
nRBC: 0 % (ref 0.0–0.2)

## 2023-02-14 LAB — COMPREHENSIVE METABOLIC PANEL
ALT: 20 U/L (ref 0–44)
AST: 27 U/L (ref 15–41)
Albumin: 3.8 g/dL (ref 3.5–5.0)
Alkaline Phosphatase: 68 U/L (ref 38–126)
Anion gap: 10 (ref 5–15)
BUN: 16 mg/dL (ref 8–23)
CO2: 29 mmol/L (ref 22–32)
Calcium: 9.4 mg/dL (ref 8.9–10.3)
Chloride: 98 mmol/L (ref 98–111)
Creatinine, Ser: 1.02 mg/dL — ABNORMAL HIGH (ref 0.44–1.00)
GFR, Estimated: >60 mL/min (ref >60)
Glucose, Bld: 105 mg/dL — ABNORMAL HIGH (ref 70–99)
Potassium: 3.9 mmol/L (ref 3.5–5.1)
Sodium: 137 mmol/L (ref 135–145)
Total Bilirubin: 1 mg/dL (ref <1.2)
Total Protein: 7 g/dL (ref 6.5–8.1)

## 2023-02-14 MED ORDER — OXYCODONE-ACETAMINOPHEN 5-325 MG PO TABS
1.0000 | ORAL_TABLET | Freq: Once | ORAL | Status: AC
  Administered 2023-02-14: 1 via ORAL
  Filled 2023-02-14: qty 1

## 2023-02-14 MED ORDER — ONDANSETRON 4 MG PO TBDP
4.0000 mg | ORAL_TABLET | Freq: Once | ORAL | Status: AC
Start: 2023-02-14 — End: 2023-02-14
  Administered 2023-02-14: 4 mg via ORAL
  Filled 2023-02-14: qty 1

## 2023-02-14 MED ORDER — IOHEXOL 300 MG/ML  SOLN
100.0000 mL | Freq: Once | INTRAMUSCULAR | Status: AC | PRN
  Administered 2023-02-14: 100 mL via INTRAVENOUS

## 2023-02-14 NOTE — ED Provider Notes (Signed)
[facility] Provider Note   CSN: [id number] Arrival date & time: 02/14/23  1416     History  Chief Complaint  Patient presents with   Back Pain    Sylvia Mcintosh is a 67 y.o. female who presents emergency department with chief complaint of back injury.  Patient states that she was helping to hang a sign at her church and standing on the chair or chair.  When she turned around to get down she fell backward onto her back.  She is complaining of severe pain in her thoracic and lumbar spine.  She did not hit her head or lose consciousness.  She took 2 Aleve prior to arrival.  She denies numbness, tingling, saddle anesthesia or inability to ambulate.   Back Pain      Home Medications Prior to Admission medications   Medication Sig Start Date End Date Taking? Authorizing Provider  phentermine 30 MG capsule Take 30 mg by mouth every 7 (seven) days.   Yes [provider]  Multiple Vitamins-Minerals (WOMENS MULTIVITAMIN PO) Take by mouth daily.    [provider]      Allergies    Penicillin g and Citalopram    Review of Systems   Review of Systems  Musculoskeletal:  Positive for back pain.    Physical Exam Updated Vital Signs BP 108/68 (BP Location: Right Arm)   Pulse 69   Temp 97.6 F (36.4 C) (Oral)   Resp 18   Ht 5\' 6"  (1.676 m)   Wt 77.1 kg   SpO2 100%   BMI 27.44 kg/m  Physical Exam Vitals and nursing note reviewed.  Constitutional:      General: She is not in acute distress.    Appearance: She is well-developed. She is not diaphoretic.  HENT:     Head: Normocephalic and atraumatic.     Right Ear: External ear normal.     Left Ear: External ear normal.     Nose: Nose normal.     Mouth/Throat:     Mouth: Mucous membranes are moist.  Eyes:     General: No scleral icterus.    Conjunctiva/sclera: Conjunctivae normal.  Cardiovascular:     Rate and Rhythm: Normal rate and regular rhythm.      Heart sounds: Normal heart sounds. No murmur heard.    No friction rub. No gallop.  Pulmonary:     Effort: Pulmonary effort is normal. No respiratory distress.     Breath sounds: Normal breath sounds.  Abdominal:     General: Bowel sounds are normal. There is no distension.     Palpations: Abdomen is soft. There is no mass.     Tenderness: There is no abdominal tenderness. There is no guarding.  Musculoskeletal:     Cervical back: Normal range of motion.     Comments: No midline spinal tenderness, significant pain with change in position, pain to palpation in the bilateral lower lumbar and lower thoracic lumbar paraspinal muscles, limited range of motion.  Skin:    General: Skin is warm and dry.  Neurological:     Mental Status: She is alert and oriented to person, place, and time.  Psychiatric:        Behavior: Behavior normal.     ED Results / Procedures / Treatments   Labs (all labs ordered are listed, but only abnormal results are displayed) Labs Reviewed - No data to display  EKG None  Radiology No results  found.  Procedures Procedures    Medications Ordered in ED Medications - No data to display  ED Course/ Medical Decision Making/ A&P                                 Medical Decision Making Amount and/or Complexity of Data Reviewed Labs: ordered. Radiology: ordered.  Risk Prescription drug management.   Patient here with back pain. Imaging pending Sign out given to Pa ransom at shift change        Final Clinical Impression(s) / ED Diagnoses Final diagnoses:  None    Rx / DC Orders ED Discharge Orders     None         Arthor Captain, PA-C 02/15/23 1544    Tanda Rockers A, DO 02/15/23 1706

## 2023-02-14 NOTE — ED Notes (Signed)
Pt states pain is starting to come back, and radiating towards right flank and abdomen, EDP notified

## 2023-02-14 NOTE — ED Provider Notes (Signed)
Physical Exam  BP 123/82   Pulse 68   Temp 98 F (36.7 C)   Resp 18   Ht 5\' 6"  (1.676 m)   Wt 77.1 kg   SpO2 100%   BMI 27.44 kg/m   Physical Exam Vitals and nursing note reviewed.  Constitutional:      General: She is not in acute distress.    Appearance: She is not ill-appearing or toxic-appearing.  HENT:     Mouth/Throat:     Mouth: Mucous membranes are moist.  Eyes:     General: No scleral icterus.    Pupils: Pupils are equal, round, and reactive to light.  Cardiovascular:     Rate and Rhythm: Normal rate.  Pulmonary:     Effort: Pulmonary effort is normal. No respiratory distress.  Chest:     Chest wall: Tenderness present.       Comments: Tenderness without crepitus.  No overlying skin changes or signs of trauma.  No step-off or deformity. Abdominal:     Palpations: Abdomen is soft.     Tenderness: There is abdominal tenderness. There is no guarding or rebound.       Comments: Tenderness to palpation.  No step-offs or deformities.  No overlying skin changes or signs of trauma.  Soft.  Musculoskeletal:        General: Tenderness present.     Comments: Mainly has some paraspinal tenderness, but does have point tenderness to upper L/lower T spine. No step off or deformity, no overlying skin changes or signs of trauma.  Strength and sensation intact and symmetric in bilateral upper and lower extremities.  Neurovascular intact distally.  Skin:    General: Skin is warm and dry.  Neurological:     Mental Status: She is alert.     Cranial Nerves: No dysarthria.     Sensory: No sensory deficit.     Motor: No weakness.     Procedures  Procedures  ED Course / MDM    Medical Decision Making Amount and/or Complexity of Data Reviewed Labs: ordered. Radiology: ordered.  Risk Prescription drug management.   Accepted handoff at shift change from [facility]. Please see prior provider note for more detail.   Briefly: Patient is 67 y.o. F presenting to  the ER for evaluation of back pain after a fall.   DDX: concern for fractures  Plan: follow up on XR.  XR imaging shows 1. Age indeterminate superior endplate fracture of T11 with approximately 30-40% loss of height. No retropulsion. Correlation for point tenderness would be helpful in determining acuity. Per radiologist's interpretation.   I assessed the patient at bedside.  She reports that she actually fell onto her right side and is been having some right rib pain as well.  She is not having any head or neck pain and denies hitting her head or neck however she reports that she did roll onto her back.  She is having some pain but no trouble walking.  Denies any chest pain or shortness of breath.  She reports that she is feeling so abdominal discomfort as well however may be due to the pain medication.  Given her age as well as fracture seen on x-ray imaging, will scan her spine as well as her chest abdomen pelvis given the tenderness to the right ribs and right sided abdomen.  There is no overlying bruises or skin changes noted.  No step-offs or deformities.  No crepitus.  CT CAP Compression fractures through the  superior endplates of T11 and L2. No acute cardiopulmonary disease. No solid organ injury. Coronary artery disease, aortic atherosclerosis. CT T/L spine Acute wedge compression fractures of T11 and L2 with less than 25% height loss. No retropulsion. CT c-spine/head  1. No acute intracranial abnormality. 2. Chronic ischemic microangiopathy. 3. No acute fracture or static subluxation of the cervical spine.  Per radiologist's interpretation.     CAP read and spine reads were different. Given this, neuosurgery was paged. The attending spoke with Dr. Jake Samples with neurosurgery who is recommending TLSO brace and follow up in office.   I discussed the imaging results and plan with the patient. Will order her some narcotics as well. We discussed the need to follow up with neurosurgery. Discussed  strict return precautions red flag symptoms.  Patient verbalized understanding and agrees to the plan.  Patient is stable and being discharged home in good condition.   CT CHEST ABDOMEN PELVIS W CONTRAST  Result Date: 02/15/2023 CLINICAL DATA:  Polytrauma, blunt.  Fall EXAM: CT CHEST, ABDOMEN, AND PELVIS WITH CONTRAST TECHNIQUE: Multidetector CT imaging of the chest, abdomen and pelvis was performed following the standard protocol during bolus administration of intravenous contrast. RADIATION DOSE REDUCTION: This exam was performed according to the departmental dose-optimization program which includes automated exposure control, adjustment of the mA and/or kV according to patient size and/or use of iterative reconstruction technique. CONTRAST:  OMNIPAQUE IOHEXOL 300 MG/ML  SOLN COMPARISON:  None Available. FINDINGS: CT CHEST FINDINGS Cardiovascular: Heart is normal size. Aorta is normal caliber. Calcifications in the left anterior descending coronary artery. Scattered aortic calcifications. Mediastinum/Nodes: No mediastinal, hilar, or axillary adenopathy. Trachea and esophagus are unremarkable. Thyroid unremarkable. Lungs/Pleura: Lungs are clear. No focal airspace opacities or suspicious nodules. No effusions. Musculoskeletal: Chest wall soft tissues are unremarkable. Mild compression fracture through the superior endplate of T11. CT ABDOMEN PELVIS FINDINGS Hepatobiliary: No hepatic injury or perihepatic hematoma. Gallbladder is unremarkable. Pancreas: No focal abnormality or ductal dilatation. Spleen: No splenic injury or perisplenic hematoma. Adrenals/Urinary Tract: No adrenal hemorrhage or renal injury identified. Bladder is unremarkable. Stomach/Bowel: Normal appendix. Stomach, large and small bowel grossly unremarkable. Vascular/Lymphatic: No evidence of aneurysm or adenopathy. Reproductive: Uterus and adnexa unremarkable.  No mass. Other: No free fluid or free air. Musculoskeletal: Mild compression  fracture through the superior endplate of L2. IMPRESSION: Compression fractures through the superior endplates of T11 and L2. No acute cardiopulmonary disease. No solid organ injury. Coronary artery disease, aortic atherosclerosis. Electronically Signed   By: Charlett Nose M.D.   On: 02/15/2023 00:01   CT T-SPINE NO CHARGE  Result Date: 02/14/2023 : CLINICAL DATA:   Fall EXAM: CT Thoracic and Lumbar spine without contrast TECHNIQUE: Multiplanar CT images of the thoracic and lumbar spine were reconstructed from contemporary CT of the Chest, Abdomen, and Pelvis. RADIATION DOSE REDUCTION: This exam was performed according to the departmental dose-optimization program which includes automated exposure control, adjustment of the mA and/or kV according to patient size and/or use of iterative reconstruction technique. CONTRAST:   No additional COMPARISON:   None Available. FINDINGS: CT THORACIC SPINE FINDINGS Alignment: Normal. Vertebrae: There is an acute wedge compression fracture of T11 with less than 25% height loss. No retropulsion. Paraspinal and other soft tissues: Please see dedicated report for CT chest, abdomen and pelvis. Disc levels: No bony spinal canal stenosis. CT LUMBAR SPINE FINDINGS Segmentation: Standard Alignment: Normal. Vertebrae: Acute wedge compression fracture of L2 with less than 25% height loss. No other fracture. Paraspinal  and other soft tissues: Please see dedicated report for CT chest, abdomen and pelvis. Disc levels: No bony spinal canal stenosis. IMPRESSION: Acute wedge compression fractures of T11 and L2 with less than 25% height loss. No retropulsion. Electronically Signed   By: Deatra Robinson M.D.   On: 02/14/2023 23:37   CT L-SPINE NO CHARGE  Result Date: 02/14/2023 CLINICAL DATA:  Fall EXAM: CT Thoracic and Lumbar spine without contrast TECHNIQUE: Multiplanar CT images of the thoracic and lumbar spine were reconstructed from contemporary CT of the Chest, Abdomen, and Pelvis.  RADIATION DOSE REDUCTION: This exam was performed according to the departmental dose-optimization program which includes automated exposure control, adjustment of the mA and/or kV according to patient size and/or use of iterative reconstruction technique. CONTRAST:  No additional COMPARISON:  None Available. FINDINGS: CT THORACIC SPINE FINDINGS Alignment: Normal. Vertebrae: There is an acute wedge compression fracture of T11 with less than 25% height loss. No retropulsion. Paraspinal and other soft tissues: Please see dedicated report for CT chest, abdomen and pelvis. Disc levels: No bony spinal canal stenosis. CT LUMBAR SPINE FINDINGS Segmentation: Standard Alignment: Normal. Vertebrae: Acute wedge compression fracture of L2 with less than 25% height loss. No other fracture. Paraspinal and other soft tissues: Please see dedicated report for CT chest, abdomen and pelvis. Disc levels: No bony spinal canal stenosis. IMPRESSION: Acute wedge compression fractures of T11 and L2 with less than 25% height loss. No retropulsion. Electronically Signed   By: Deatra Robinson M.D.   On: 02/14/2023 23:31   CT Cervical Spine Wo Contrast  Result Date: 02/14/2023 CLINICAL DATA:  Fall EXAM: CT HEAD WITHOUT CONTRAST CT CERVICAL SPINE WITHOUT CONTRAST TECHNIQUE: Multidetector CT imaging of the head and cervical spine was performed following the standard protocol without intravenous contrast. Multiplanar CT image reconstructions of the cervical spine were also generated. RADIATION DOSE REDUCTION: This exam was performed according to the departmental dose-optimization program which includes automated exposure control, adjustment of the mA and/or kV according to patient size and/or use of iterative reconstruction technique. COMPARISON:  None Available. FINDINGS: CT HEAD FINDINGS Brain: There is no mass, hemorrhage or extra-axial collection. The size and configuration of the ventricles and extra-axial CSF spaces are normal. There is  hypoattenuation of the periventricular white matter, most commonly indicating chronic ischemic microangiopathy. Vascular: Atherosclerotic calcification of the internal carotid arteries at the skull base. No abnormal hyperdensity of the major intracranial arteries or dural venous sinuses. Skull: The visualized skull base, calvarium and extracranial soft tissues are normal. Sinuses/Orbits: No fluid levels or advanced mucosal thickening of the visualized paranasal sinuses. No mastoid or middle ear effusion. The orbits are normal. CT CERVICAL SPINE FINDINGS Alignment: No static subluxation. Facets are aligned. Occipital condyles are normally positioned. Skull base and vertebrae: No acute fracture. Soft tissues and spinal canal: No prevertebral fluid or swelling. No visible canal hematoma. Disc levels: No advanced spinal canal or neural foraminal stenosis. Upper chest: No pneumothorax, pulmonary nodule or pleural effusion. Other: Normal visualized paraspinal cervical soft tissues. IMPRESSION: 1. No acute intracranial abnormality. 2. Chronic ischemic microangiopathy. 3. No acute fracture or static subluxation of the cervical spine. Electronically Signed   By: Deatra Robinson M.D.   On: 02/14/2023 23:12   CT Head Wo Contrast  Result Date: 02/14/2023 CLINICAL DATA:  Fall EXAM: CT HEAD WITHOUT CONTRAST CT CERVICAL SPINE WITHOUT CONTRAST TECHNIQUE: Multidetector CT imaging of the head and cervical spine was performed following the standard protocol without intravenous contrast. Multiplanar CT image reconstructions  of the cervical spine were also generated. RADIATION DOSE REDUCTION: This exam was performed according to the departmental dose-optimization program which includes automated exposure control, adjustment of the mA and/or kV according to patient size and/or use of iterative reconstruction technique. COMPARISON:  None Available. FINDINGS: CT HEAD FINDINGS Brain: There is no mass, hemorrhage or extra-axial collection.  The size and configuration of the ventricles and extra-axial CSF spaces are normal. There is hypoattenuation of the periventricular white matter, most commonly indicating chronic ischemic microangiopathy. Vascular: Atherosclerotic calcification of the internal carotid arteries at the skull base. No abnormal hyperdensity of the major intracranial arteries or dural venous sinuses. Skull: The visualized skull base, calvarium and extracranial soft tissues are normal. Sinuses/Orbits: No fluid levels or advanced mucosal thickening of the visualized paranasal sinuses. No mastoid or middle ear effusion. The orbits are normal. CT CERVICAL SPINE FINDINGS Alignment: No static subluxation. Facets are aligned. Occipital condyles are normally positioned. Skull base and vertebrae: No acute fracture. Soft tissues and spinal canal: No prevertebral fluid or swelling. No visible canal hematoma. Disc levels: No advanced spinal canal or neural foraminal stenosis. Upper chest: No pneumothorax, pulmonary nodule or pleural effusion. Other: Normal visualized paraspinal cervical soft tissues. IMPRESSION: 1. No acute intracranial abnormality. 2. Chronic ischemic microangiopathy. 3. No acute fracture or static subluxation of the cervical spine. Electronically Signed   By: Deatra Robinson M.D.   On: 02/14/2023 23:12   DG Lumbar Spine Complete  Result Date: 02/14/2023 CLINICAL DATA:  Fall, back pain EXAM: LUMBAR SPINE - COMPLETE 4+ VIEW COMPARISON:  None Available. FINDINGS: The normal lumbar lordosis. No acute fracture or listhesis of the lumbar spine. Vertebral body height is preserved. There is intervertebral disc space narrowing at L5-S1 in keeping with changes of mild degenerative disc disease at this level. Remaining intervertebral disc heights are preserved. Oblique views demonstrate facet arthrosis at L4-S1 bilaterally. Incidentally noted is an age indeterminate superior endplate fracture of T11, better assessed on accompanying  radiographs of the thoracic spine. IMPRESSION: 1. No acute fracture or listhesis of the lumbar spine. 2. Mild degenerative disc disease and facet arthrosis at L5-S1. 3. Age indeterminate superior endplate fracture of T11, better assessed on accompanying radiographs of the thoracic spine. Electronically Signed   By: Helyn Numbers M.D.   On: 02/14/2023 20:15   DG Thoracic Spine 2 View  Result Date: 02/14/2023 CLINICAL DATA:  Fall, back pain EXAM: THORACIC SPINE 2 VIEWS COMPARISON:  None Available. FINDINGS: A age indeterminate superior endplate fracture of T11 is seen with approximately 30-40% loss of height. No retropulsion. No listhesis. Normal thoracic kyphosis. Mild endplate changes noted within the midthoracic spine are in keeping with changes of mild degenerative disc disease. Paraspinal soft tissues are unremarkable. IMPRESSION: 1. Age indeterminate superior endplate fracture of T11 with approximately 30-40% loss of height. No retropulsion. Correlation for point tenderness would be helpful in determining acuity. Electronically Signed   By: Helyn Numbers M.D.   On: 02/14/2023 20:13        Achille Rich, PA-C 02/15/23 1716    Sloan Leiter, DO 02/15/23 2359

## 2023-02-14 NOTE — ED Triage Notes (Addendum)
In for eval of fall. She was stepping down and fell straight on her back. Happened 1 hr pta at church. Denies hitting head. Took 2 aleve approx 30 minutes PTA

## 2023-02-15 MED ORDER — KETOROLAC TROMETHAMINE 30 MG/ML IJ SOLN
30.0000 mg | Freq: Once | INTRAMUSCULAR | Status: AC
  Administered 2023-02-15: 30 mg via INTRAVENOUS
  Filled 2023-02-15: qty 1

## 2023-02-15 MED ORDER — OXYCODONE-ACETAMINOPHEN 5-325 MG PO TABS: 1.0000 | ORAL_TABLET | Freq: Four times a day (QID) | ORAL | 0 refills | Status: DC | PRN

## 2023-02-15 NOTE — Discharge Instructions (Addendum)
You were seen in the ER for evaluation of your fall. It appears that you have to fractures in back. The neurosurgeon would like to see you in office. I have included the information for Dr. Jake Samples into the discharge paperwork.  Please make sure you call to schedule an appointment.  I am also sending you home with a back brace.  Please use.  I have included more omission on fractures and back braces into the discharge paperwork.  Please review.  For pain, I recommend taking 650mg  of Tylenol and/or 600 mg of ibuprofen every 6 hours as needed for pain.  Additionally, going to send you home with some narcotic pain and occasion to take for breakthrough pain.  Please do not drive or operate any heavy machinery while on these medications as they can make you sleepy.  Likely will have some aches and pains over the next few days.  If you have any trouble walking, numbness, tingling, weakness, not able to control your bowel or bladder, numbness in your vaginal or rectal region, please return to your nearest emerged part for reevaluation.  If you have new concerns, new or worsening symptoms, please return to your nearest emerged part for evaluation.  Contact a health care provider if: You have a fever. Your pain medicine is not helping. Your pain does not get better over time. You cannot return to your normal activities as planned or expected. Get help right away if: You have difficulty breathing. Your pain is very bad and it suddenly gets worse. You have numbness, tingling, or weakness in any part of your body. You are unable to empty your bladder. You cannot control when you urinate or have bowel movements. You are unable to move any body part that is below the level of your injury. You have pain in your abdomen or uncontrolled vomiting. You have a warm, tender swelling in your leg. These symptoms may be an emergency. Get help right away. Call 911. Do not wait to see if the symptoms will go away. Do not  drive yourself to the hospital.

## 2023-02-15 NOTE — Progress Notes (Signed)
Orthopedic Tech Progress Note Patient Details:  Sylvia Mcintosh January 14, 1956 [id number]  RN called requesting a TLSO BACK BRACE, so I called in stat order to [facility]. I have a verbal order not a written order for the back brace   Patient ID: Sylvia Mcintosh, female   DOB: [birth year], 67 y.o.   MRN: [id number]  Donald Pore 02/15/2023, 1:10 AM

## 2023-02-15 NOTE — ED Provider Notes (Signed)
Case d/w Dr. Jake Samples of neurosurgery TLSO and follow up in the office.    Linn Clavin, MD 02/15/23 [phone]

## 2023-02-15 NOTE — ED Notes (Signed)
TLSO brace applied by Ortho Tech per EDP order

## 2023-11-11 ENCOUNTER — Other Ambulatory Visit: Payer: Self-pay | Admitting: Internal Medicine

## 2023-11-11 DIAGNOSIS — Z1231 Encounter for screening mammogram for malignant neoplasm of breast: Secondary | ICD-10-CM

## 2023-12-13 ENCOUNTER — Ambulatory Visit
Admission: RE | Admit: 2023-12-13 | Discharge: 2023-12-13 | Disposition: A | Discharge status: Home / Self Care | Source: Ambulatory Visit | Attending: Internal Medicine | Admitting: Internal Medicine

## 2023-12-13 DIAGNOSIS — Z1231 Encounter for screening mammogram for malignant neoplasm of breast: Secondary | ICD-10-CM

## 2024-02-26 ENCOUNTER — Ambulatory Visit (INDEPENDENT_AMBULATORY_CARE_PROVIDER_SITE_OTHER): Admitting: Obstetrics and Gynecology

## 2024-02-26 ENCOUNTER — Encounter: Payer: Self-pay | Admitting: Obstetrics and Gynecology

## 2024-02-26 ENCOUNTER — Other Ambulatory Visit (HOSPITAL_COMMUNITY)
Admission: RE | Admit: 2024-02-26 | Discharge: 2024-02-26 | Disposition: A | Discharge status: Home / Self Care | Source: Ambulatory Visit | Attending: Obstetrics and Gynecology | Admitting: Obstetrics and Gynecology

## 2024-02-26 VITALS — BP 132/74 | HR 79 | Ht 64.25 in | Wt 189.0 lb

## 2024-02-26 DIAGNOSIS — Z800 Family history of malignant neoplasm of digestive organs: Secondary | ICD-10-CM

## 2024-02-26 DIAGNOSIS — D508 Other iron deficiency anemias: Secondary | ICD-10-CM

## 2024-02-26 DIAGNOSIS — M810 Age-related osteoporosis without current pathological fracture: Secondary | ICD-10-CM | POA: Diagnosis not present

## 2024-02-26 DIAGNOSIS — K220 Achalasia of cardia: Secondary | ICD-10-CM | POA: Diagnosis not present

## 2024-02-26 DIAGNOSIS — N393 Stress incontinence (female) (male): Secondary | ICD-10-CM

## 2024-02-26 DIAGNOSIS — M8008XA Age-related osteoporosis with current pathological fracture, vertebra(e), initial encounter for fracture: Secondary | ICD-10-CM

## 2024-02-26 DIAGNOSIS — Z01419 Encounter for gynecological examination (general) (routine) without abnormal findings: Secondary | ICD-10-CM

## 2024-02-26 DIAGNOSIS — M8580 Other specified disorders of bone density and structure, unspecified site: Secondary | ICD-10-CM

## 2024-02-26 DIAGNOSIS — Z9189 Other specified personal risk factors, not elsewhere classified: Secondary | ICD-10-CM

## 2024-02-26 DIAGNOSIS — T148XXA Other injury of unspecified body region, initial encounter: Secondary | ICD-10-CM

## 2024-02-26 MED ORDER — ESTRADIOL 0.01 % VA CREA: 1.0000 | TOPICAL_CREAM | Freq: Every day | VAGINAL | 0 refills | Status: AC

## 2024-02-26 NOTE — Progress Notes (Unsigned)
 68 y.o. y.o. female here for annual medicare gyn exam. No LMP recorded. Patient is postmenopausal.    P 04/14/21 M 12/13/23 C 11/26/19 family history of colon cancer D 08/24/20 -1.4 needs repeat.  Had fall 2024 with vertebral fracture needs evenity/prolia. Has achalasia cannot do fosamax/ SUI: referral to urogyn. Begin vaginal estrogen Body mass index is 32.19 kg/m.   Blood pressure 132/74, pulse 79, height 5' 4.25 (1.632 m), weight 189 lb (85.7 kg), SpO2 98%.     Component Value Date/Time   DIAGPAP  04/14/2021 0921    - Negative for intraepithelial lesion or malignancy (NILM)   ADEQPAP  04/14/2021 0921    Satisfactory for evaluation; transformation zone component PRESENT.    GYN HISTORY:    Component Value Date/Time   DIAGPAP  04/14/2021 0921    - Negative for intraepithelial lesion or malignancy (NILM)   ADEQPAP  04/14/2021 0921    Satisfactory for evaluation; transformation zone component PRESENT.    OB History  Gravida Para Term Preterm AB Living  5 1 1  4 1   SAB IAB Ectopic Multiple Live Births  4        # Outcome Date GA Lbr Len/2nd Weight Sex Type Anes PTL Lv  5 SAB           4 SAB           3 SAB           2 SAB           1 Term             Past Medical History:  Diagnosis Date  . Allergy    Seasonal rhinoconjunctivitis with voice loss  . Cataract 2020   Left eye sx  . Depression 2012   work stress related  . Family history of breast cancer   . Family history of colon cancer   . Family history of pancreatic cancer   . Family history of prostate cancer   . H/O oral surgery   . Mitral valve prolapse 1984   2 D ECHO  . Nonspecific abnormal electrocardiogram (ECG) (EKG) 2004   Negative nuclear stress test  . Osteopenia 10/2014   T score -1.5 FRAX 2.5%/0.1%. No significant change from prior DEXA.  . Vitamin D  deficiency     Past Surgical History:  Procedure Laterality Date  . BUNIONECTOMY    . cataract surg  2020  . COLONOSCOPY  2012    Negative; [place]  . ECTOPIC PREGNANCY SURGERY    . HAMMER TOE SURGERY     Bilateral  . MOUTH SURGERY     Implants  . TUBAL LIGATION    . Tubal Reversal      Current Outpatient Medications on File Prior to Visit  Medication Sig Dispense Refill  . Fluocinolone Acetonide Scalp 0.01 % OIL Apply three times a week to affected areas on the scalp    . minoxidil (LONITEN) 2.5 MG tablet Take 2.5 mg by mouth daily.    . Multiple Vitamins-Minerals (WOMENS MULTIVITAMIN PO) Take by mouth daily.    . oxyCODONE -acetaminophen  (PERCOCET/ROXICET) 5-325 MG tablet Take 1 tablet by mouth every 6 (six) hours as needed for severe pain (pain score 7-10). (Patient not taking: Reported on 02/26/2024) 15 tablet 0  . phentermine 30 MG capsule Take 30 mg by mouth every 7 (seven) days. (Patient not taking: Reported on 02/26/2024)     No current facility-administered medications on file prior to visit.  Social History   Socioeconomic History  . Marital status: Married    Spouse name: Not on file  . Number of children: Not on file  . Years of education: Not on file  . Highest education level: Not on file  Occupational History  . Not on file  Tobacco Use  . Smoking status: Never  . Smokeless tobacco: Never  Vaping Use  . Vaping status: Never Used  Substance and Sexual Activity  . Alcohol use: No    Alcohol/week: 0.0 standard drinks of alcohol  . Drug use: No  . Sexual activity: Not Currently    Partners: Male    Birth control/protection: Post-menopausal    Comment: 1st intercourse 72 yo-1 partner, less, than 5 lifetime partners, no DES, no hx abnormal paps, no hx breast cancer  Other Topics Concern  . Not on file  Social History Narrative  . Not on file   Social Drivers of Health   Financial Resource Strain: Not on file  Food Insecurity: Not on file  Transportation Needs: Not on file  Physical Activity: Not on file  Stress: Not on file  Social Connections: Not on file  Intimate Partner  Violence: Not on file    Family History  Problem Relation Age of Onset  . Breast cancer Mother 67       d. 77  . Diabetes Father   . Heart murmur Father        two valves replaced  . Prostate cancer Father   . Leukemia Father   . Colon cancer Father   . Hypertension Sister   . Diabetes Sister        diet & exercise controlled  . Stroke Sister   . Hypertension Brother   . Breast cancer Maternal Aunt 75  . Cancer Maternal Aunt        NOS  . Pancreatic cancer Paternal Aunt   . Heart attack Maternal Grandmother 70       Also stroke, hypertension  . Stroke Maternal Grandmother        in 34s  . Hypertension Daughter   . Pancreatic cancer Cousin        pat cousin  . Colon cancer Cousin        mat cousin  . Pancreatic cancer Half-Sister 50       pat half sister     Allergies  Allergen Reactions  . Penicillin G Other (See Comments)    Young adult allergy Young adult allergy   . Ferrous Sulfate   . Citalopram      made me paranoid      Patient's last menstrual period was No LMP recorded. Patient is postmenopausal..          Sexually active: ***  Exercising: ***   Review of Systems Alls systems reviewed and are negative.     OBGyn Exam    A:         Well Woman GYN exam                             P:        Pap smear {Pap indication:31164} Encouraged annual mammogram screening Colon cancer screening {Colon cancer screening:31170} DXA {DXA screening:31171} Labs and immunizations {annual labs:31172} Discussed breast self exams Encouraged healthy lifestyle practices Encouraged Vit D and Calcium   No follow-ups on file.  Sylvia Mcintosh

## 2024-02-27 ENCOUNTER — Encounter: Payer: Self-pay | Admitting: Obstetrics and Gynecology

## 2024-02-27 ENCOUNTER — Other Ambulatory Visit: Payer: Self-pay | Admitting: Obstetrics and Gynecology

## 2024-02-27 DIAGNOSIS — M8000XD Age-related osteoporosis with current pathological fracture, unspecified site, subsequent encounter for fracture with routine healing: Secondary | ICD-10-CM

## 2024-02-27 MED ORDER — EVENITY 105 MG/1.17ML ~~LOC~~ SOSY: 210.0000 mg | PREFILLED_SYRINGE | SUBCUTANEOUS | 0 refills | Status: DC

## 2024-02-28 ENCOUNTER — Telehealth: Payer: Self-pay

## 2024-02-28 NOTE — Telephone Encounter (Signed)
 Evenity  VOB initiated via [email or web address]  Last OV:  Next OV:  Last Evenity  inj:  Next Evenity  inj DUE: NEW START

## 2024-03-02 ENCOUNTER — Ambulatory Visit (HOSPITAL_BASED_OUTPATIENT_CLINIC_OR_DEPARTMENT_OTHER)
Admission: RE | Admit: 2024-03-02 | Discharge: 2024-03-02 | Disposition: A | Discharge status: Home / Self Care | Payer: Self-pay | Source: Ambulatory Visit | Attending: Obstetrics and Gynecology | Admitting: Obstetrics and Gynecology

## 2024-03-02 DIAGNOSIS — Z9189 Other specified personal risk factors, not elsewhere classified: Secondary | ICD-10-CM | POA: Insufficient documentation

## 2024-03-02 LAB — CYTOLOGY - PAP
Diagnosis: NEGATIVE
Diagnosis: REACTIVE

## 2024-03-03 ENCOUNTER — Ambulatory Visit: Payer: Self-pay | Admitting: Obstetrics and Gynecology

## 2024-03-05 ENCOUNTER — Other Ambulatory Visit: Payer: Self-pay | Admitting: Obstetrics and Gynecology

## 2024-03-05 DIAGNOSIS — Q249 Congenital malformation of heart, unspecified: Secondary | ICD-10-CM

## 2024-03-09 NOTE — Telephone Encounter (Signed)
 Sylvia Mcintosh

## 2024-03-11 ENCOUNTER — Encounter (HOSPITAL_BASED_OUTPATIENT_CLINIC_OR_DEPARTMENT_OTHER): Payer: Self-pay | Admitting: Cardiology

## 2024-03-11 ENCOUNTER — Ambulatory Visit (INDEPENDENT_AMBULATORY_CARE_PROVIDER_SITE_OTHER): Admitting: Cardiology

## 2024-03-11 ENCOUNTER — Other Ambulatory Visit (HOSPITAL_BASED_OUTPATIENT_CLINIC_OR_DEPARTMENT_OTHER): Payer: Self-pay

## 2024-03-11 ENCOUNTER — Other Ambulatory Visit (HOSPITAL_COMMUNITY): Payer: Self-pay

## 2024-03-11 VITALS — BP 126/82 | HR 66 | Ht 66.0 in | Wt 191.0 lb

## 2024-03-11 DIAGNOSIS — Z79899 Other long term (current) drug therapy: Secondary | ICD-10-CM

## 2024-03-11 DIAGNOSIS — Z712 Person consulting for explanation of examination or test findings: Secondary | ICD-10-CM | POA: Diagnosis not present

## 2024-03-11 DIAGNOSIS — I2510 Atherosclerotic heart disease of native coronary artery without angina pectoris: Secondary | ICD-10-CM

## 2024-03-11 DIAGNOSIS — Z7189 Other specified counseling: Secondary | ICD-10-CM | POA: Diagnosis not present

## 2024-03-11 MED ORDER — ROSUVASTATIN CALCIUM 5 MG PO TABS
5.0000 mg | ORAL_TABLET | Freq: Every day | ORAL | 3 refills | Status: AC
  Filled 2024-03-11 (×2): qty 90, 90d supply, fill #0

## 2024-03-11 NOTE — Patient Instructions (Signed)
 Medication Instructions:  Start rosuvastatin (Crestor) 5 mg - take one tablet daily for lipids  *If you need a refill on your cardiac medications before your next appointment, please call your pharmacy*  Lab Work: Return or go to any [facility] in about 3 months for blood work (lipids, liver function)   Testing/Procedures: none  Follow-Up: As needed

## 2024-03-11 NOTE — Progress Notes (Signed)
 Cardiology Office Note:  .   Date:  03/11/2024  ID:  Sylvia Mcintosh, DOB Oct 01, 1955, MRN [id number] PCP: [facility] Providers Cardiologist:  Shelda Bruckner, MD {  History of Present Illness: .   Irlene Crudup is a 68 y.o. female without prior significant cardiac history seen as a new patient consultation for the evaluation of elevated calcium score  Referral from 03/05/24 reviewed. Note from Dr. Glennon from 02/26/24 reviewed. No cardiac concerns noted at that visit. Had calcium score 03/02/24 which showed Ca score 508 (most in LAD), which is 95th percentile. Referred to cardiology for further evaluation.  Cardiovascular risk factors: Prior clinical ASCVD: none Comorbid conditions: Denies hypertension, hyperlipidemia, diabetes (though told she is prediabetic), chronic kidney disease  Metabolic syndrome/Obesity: Highest adult weight Chronic inflammatory conditions: none Pregnancy history: Multiple pregnancies, including tubal pregnancies. Denies preeclampsia, gestational diabetes Tobacco use history: never Family history: father had heart issues, unclear what they were. Paternal grandfather had a weak heart. Maternal grandmother had unclear heart issues Prior pertinent testing and/or incidental findings: Calcium score as above. Diagnosed with mitral valve prolapse in the military in 1984 (exercise induced). Later told she likely didn't have this by the [place]. Had treadmill stress recently for shortness of breath, reported as normal Exercise level: walks regularly, 4 miles 5 days/week. Does have some shortness of breath with hills but nonlimiting Current diet: love pasta and cheese, working on diet changes  ROS: Denies chest pain, shortness of breath at rest or with normal exertion. No PND, orthopnea, LE edema or unexpected weight gain. No syncope or palpitations. ROS otherwise negative except as noted.   Studies Reviewed: SABRA    EKG:  EKG  Interpretation Date/Time:  Wednesday March 11 2024 08:36:55 EST Ventricular Rate:  66 PR Interval:  158 QRS Duration:  68 QT Interval:  372 QTC Calculation: 389 R Axis:   44  Text Interpretation: Normal sinus rhythm Nonspecific T wave abnormality Confirmed by Bruckner Shelda [phone]) on 03/11/2024 8:50:54 AM    Physical Exam:   VS:  BP 126/82 (BP Location: Left Arm, Patient Position: Sitting, Cuff Size: Large)   Pulse 66   Ht 5' 6 (1.676 m)   Wt 191 lb (86.6 kg)   SpO2 99%   BMI 30.83 kg/m    Wt Readings from Last 3 Encounters:  03/11/24 191 lb (86.6 kg)  02/26/24 189 lb (85.7 kg)  02/14/23 170 lb (77.1 kg)    GEN: Well nourished, well developed in no acute distress HEENT: Normal, moist mucous membranes NECK: No JVD CARDIAC: regular rhythm, normal S1 and S2, no rubs or gallops. No murmur. VASCULAR: Radial and DP pulses 2+ bilaterally. No carotid bruits RESPIRATORY:  Clear to auscultation without rales, wheezing or rhonchi  ABDOMEN: Soft, non-tender, non-distended MUSCULOSKELETAL:  Ambulates independently SKIN: Warm and dry, no edema NEUROLOGIC:  Alert and oriented x 3. No focal neuro deficits noted. PSYCHIATRIC:  Normal affect    ASSESSMENT AND PLAN: .    Elevated coronary calcium score, consistent with nonobstructive CAD -We reviewed the calcium score at length, including images as well as the graph showing mortality based on calcium score. We discussed the pathophysiology of cholesterol plaque formation, the role of calcium and why it is a marker, how plaque is key to acute MI/CVA, and how known plaque is managed with medications.   -has had treadmill that was normal at the [place] -we discussed the data on statins, both in terms of their long  term benefit as well as the risk of side effects. Reviewed common misconceptions about statins. Reviewed how we monitor treatment. After shared decision making, patient is agreeable to trialing statin.  -discussed guidelines  recommending aspirin. After shared decision making, patient ideclines. -reviewed red flag warning signs that need immediate medical attention  -her lipids have typically been well controlled. Never smoker, no long term inflammation. Some nonspecific family history. No history of significant radiation to her chest.   She will trial rosuvastatin 5 mg daily. Recheck lipids and lfts in 3 mos  Reported history of mitral valve prolapse: normal exam. Discussed that this is likely historically overdiagnosed. Discussed that there is no recommendation for antibiotics even if she does have this.  CV risk counseling and prevention -recommend heart healthy/Mediterranean diet, with whole grains, fruits, vegetable, fish, lean meats, nuts, and olive oil. Limit salt. -recommend moderate walking, 3-5 times/week for 30-50 minutes each session. Aim for at least 150 minutes/week. Goal should be pace of 3 miles/hours, or walking 1.5 miles in 30 minutes -recommend avoidance of tobacco products. Avoid excess alcohol.  Dispo: I would be happy to see her back as needed  Signed, Shelda Bruckner, MD   Shelda Bruckner, MD, PhD, [facility]  [facility]  [place]  [facility] at [facility] at [facility] [address] [place], [place] [address] [phone]

## 2024-03-11 NOTE — Telephone Encounter (Signed)
 RESUBMITTED BENEFITS IN [employer]

## 2024-03-13 ENCOUNTER — Other Ambulatory Visit (HOSPITAL_COMMUNITY): Payer: Self-pay

## 2024-03-19 NOTE — Telephone Encounter (Signed)
 [employer] left message on nursing supervisor line regarding benefits review for [place] .   Unable to complete request, unable to confirm insurance submitted. Message states request will be closed and reopened when updated information received. Return call to [place] at [employer] [phone]  Routing to [place] and [place] for f/u

## 2024-03-20 ENCOUNTER — Other Ambulatory Visit (HOSPITAL_COMMUNITY): Payer: Self-pay

## 2024-03-20 NOTE — Telephone Encounter (Signed)
 Resubmitted info into [employer] portal

## 2024-03-20 NOTE — Telephone Encounter (Signed)
 Message left with Amy, [employer] Support Rep to see if she can assist with sorting the insurance information out on the [employer] Support side.

## 2024-03-24 NOTE — Telephone Encounter (Signed)
° °  Still coming up missing information in [employer]. Emailed Damien from [employer] for help.

## 2024-03-26 ENCOUNTER — Other Ambulatory Visit (HOSPITAL_COMMUNITY): Payer: Self-pay

## 2024-03-26 NOTE — Telephone Encounter (Signed)
 Talked to Amy at [employer]. Per Amy, they have tried multiple times with traditional Medicare and T[employer]are and it keeps kicking back that patient has different insurance.   Rachael searched patient's Medicare number in [place]. Per [place], patient has [employer]. Plan is active through 04/08/24. The number listed is not the correct number. If patient thinks this is incorrect, she will have to contact Medicare.

## 2024-04-13 ENCOUNTER — Other Ambulatory Visit: Payer: Self-pay

## 2024-04-20 ENCOUNTER — Ambulatory Visit (HOSPITAL_BASED_OUTPATIENT_CLINIC_OR_DEPARTMENT_OTHER)
Admission: RE | Admit: 2024-04-20 | Discharge: 2024-04-20 | Disposition: A | Discharge status: Home / Self Care | Source: Ambulatory Visit | Attending: Obstetrics and Gynecology | Admitting: Obstetrics and Gynecology

## 2024-04-20 DIAGNOSIS — T148XXA Other injury of unspecified body region, initial encounter: Secondary | ICD-10-CM | POA: Insufficient documentation

## 2024-04-20 DIAGNOSIS — M8580 Other specified disorders of bone density and structure, unspecified site: Secondary | ICD-10-CM | POA: Insufficient documentation

## 2024-04-20 DIAGNOSIS — M8008XA Age-related osteoporosis with current pathological fracture, vertebra(e), initial encounter for fracture: Secondary | ICD-10-CM | POA: Insufficient documentation

## 2024-05-15 ENCOUNTER — Encounter: Payer: Self-pay | Admitting: *Deleted

## 2024-05-15 LAB — GLUCOSE, POCT (MANUAL RESULT ENTRY): Glucose Fasting, POC: 96 mg/dL (ref 70–99)

## 2024-05-15 NOTE — Progress Notes (Signed)
 Patient attended a screening event on 05/15/24 where her BP was 126/78 at second check and blood glucose was 96. Patient has a PCP, has insurance, and does not smoke. No SDOH needs indicated.
